# Patient Record
Sex: Female | Born: 2000 | Race: Black or African American | Hispanic: No | Marital: Single | Smoking: Never smoker
Health system: Southern US, Community
[De-identification: ages and names within clinical notes are randomized; demographics above are authoritative.]

---
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
## 2020-03-15 ENCOUNTER — Ambulatory Visit (LOCAL_COMMUNITY_HEALTH_CENTER): Payer: Medicaid Other | Admitting: Physician Assistant

## 2020-03-15 ENCOUNTER — Ambulatory Visit: Payer: Medicaid Other

## 2020-03-15 ENCOUNTER — Other Ambulatory Visit: Payer: Self-pay

## 2020-03-15 ENCOUNTER — Encounter: Payer: Self-pay | Admitting: Physician Assistant

## 2020-03-15 VITALS — BP 119/53 | Ht 65.0 in | Wt 137.0 lb

## 2020-03-15 DIAGNOSIS — Z3046 Encounter for surveillance of implantable subdermal contraceptive: Secondary | ICD-10-CM | POA: Diagnosis not present

## 2020-03-15 DIAGNOSIS — Z113 Encounter for screening for infections with a predominantly sexual mode of transmission: Secondary | ICD-10-CM

## 2020-03-15 DIAGNOSIS — B9689 Other specified bacterial agents as the cause of diseases classified elsewhere: Secondary | ICD-10-CM

## 2020-03-15 DIAGNOSIS — Z0000 Encounter for general adult medical examination without abnormal findings: Secondary | ICD-10-CM

## 2020-03-15 DIAGNOSIS — N760 Acute vaginitis: Secondary | ICD-10-CM

## 2020-03-15 DIAGNOSIS — Z3009 Encounter for other general counseling and advice on contraception: Secondary | ICD-10-CM

## 2020-03-15 LAB — WET PREP FOR TRICH, YEAST, CLUE
Trichomonas Exam: NEGATIVE
Yeast Exam: NEGATIVE

## 2020-03-15 MED ORDER — METRONIDAZOLE 500 MG PO TABS: 500.0000 mg | ORAL_TABLET | Freq: Two times a day (BID) | ORAL | 0 refills | Status: AC

## 2020-03-15 NOTE — Progress Notes (Signed)
Wet mount reviewed by provider; per verbal order by Sadie Haber, PA, pt treated for BV per standing order. Provider orders completed.

## 2020-03-15 NOTE — Progress Notes (Signed)
Pt is here for physical, to check Nexplanon and STD screening. Pt reports no issues with Nexplanon at this time. Pt feels like her PH balance has been off. Per [facility] records. Nexplanon was placed 06/2017.

## 2020-03-16 ENCOUNTER — Encounter: Payer: Self-pay | Admitting: Physician Assistant

## 2020-03-16 NOTE — Progress Notes (Signed)
Family Planning Visit- Repeat Yearly Visit  Subjective:  Cheyenne Wise is a 19 y.o. No obstetric history on file.  being seen today for an well woman visit and to discuss family planning options.    She is currently using Nexplanon for pregnancy prevention. Patient reports she does not  want a pregnancy in the next year. Patient  does not have a problem list on file.  Chief Complaint  Patient presents with  . Contraception    Physical and STD screening    Patient reports she is doing OK with the Nexplanon but is not sure yet whether she will get another one when it is time for removal or try something else.  Patient states that since she got the Nexplanon she has had weight fluctuations.  Reports that she has had a vaginal odor sometimes with irritation and thinks that her pH balance is off. Per chart review, CBE due 2022 and pap is due 2023.  Patient denies any other concerns today.    See flowsheet for other program required questions.   Body mass index is 22.8 kg/m. - Patient is eligible for diabetes screening based on BMI and age >61?  not applicable HA1C ordered? not applicable  Patient reports 2  partners in last year. Desires STI screening?  Yes   Has patient been screened once for HCV in the past?  No  No results found for: HCVAB  Does the patient have current of drug use, have a partner with drug use, and/or has been incarcerated since last result? No  If yes-- Screen for HCV through [facility]   Does the patient meet criteria for HBV testing? No  Criteria:  -Household, sexual or needle sharing contact with HBV -History of drug use -HIV positive -Those with known Hep C   Health Maintenance Due  Topic Date Due  . Hepatitis C Screening  Never done  . CHLAMYDIA SCREENING  Never done  . HIV Screening  Never done  . INFLUENZA VACCINE  01/21/2020    Review of Systems  All other systems reviewed and are negative.   The following portions of the patient's  history were reviewed and updated as appropriate: allergies, current medications, past family history, past medical history, past social history, past surgical history and problem list. Problem list updated.  Objective:   Vitals:   03/15/20 0845  BP: (!) 119/53  Weight: 137 lb (62.1 kg)  Height: 5\' 5"  (1.651 m)    Physical Exam Vitals and nursing note reviewed.  Constitutional:      General: She is not in acute distress.    Appearance: Normal appearance.  HENT:     Head: Normocephalic and atraumatic.  Eyes:     Conjunctiva/sclera: Conjunctivae normal.  Neck:     Thyroid: No thyroid mass, thyromegaly or thyroid tenderness.  Cardiovascular:     Rate and Rhythm: Normal rate and regular rhythm.  Pulmonary:     Effort: Pulmonary effort is normal.     Breath sounds: Normal breath sounds.  Abdominal:     Palpations: Abdomen is soft. There is no mass.     Tenderness: There is no abdominal tenderness. There is no guarding or rebound.  Musculoskeletal:     Cervical back: Neck supple. No tenderness.     Comments: Left upper arm with Nexplanon easily palpable.  Lymphadenopathy:     Cervical: No cervical adenopathy.  Skin:    General: Skin is warm and dry.  Neurological:  Mental Status: She is alert and oriented to person, place, and time.  Psychiatric:        Mood and Affect: Mood normal.        Behavior: Behavior normal.        Thought Content: Thought content normal.        Judgment: Judgment normal.       Assessment and Plan:  Cheyenne Wise is a 19 y.o. female No obstetric history on file. presenting to the [facility] Department for an yearly well woman exam/family planning visit  Contraception counseling: Reviewed all forms of birth control options in the tiered based approach. available including abstinence; over the counter/barrier methods; hormonal contraceptive medication including pill, patch, ring, injection,contraceptive implant, ECP; hormonal and  nonhormonal IUDs; permanent sterilization options including vasectomy and the various tubal sterilization modalities. Risks, benefits, and typical effectiveness rates were reviewed.  Questions were answered.  Written information was also given to the patient to review.  Patient desires to continue with Nexplanon until removal is due, this was prescribed for patient. She will follow up in  4 months  for surveillance.  She was told to call with any further questions, or with any concerns about this method of contraception.  Emphasized use of condoms 100% of the time for STI prevention.  Patient was not a candidate for ECP today.    1. Encounter for counseling regarding contraception Reviewed with patient BCMs and SE so that patient can think about options and decide by the time Nexplanon removal is due. Rec condoms with all sex.  2. Screening for STD (sexually transmitted disease) Await test results.  Counseled that RN will call if she needs to RTC for treatment once results are back.  - WET PREP FOR TRICH, YEAST, CLUE - Chlamydia/Gonorrhea  Lab  3. Encounter for surveillance of implantable subdermal contraceptive Nexplanon palpable in upper left arm.  4. Well woman exam (no gynecological exam) Reviewed healthy habits with patient for general health. Enc MVI 1 po daily. Enc to establish with/follow up with PCP for primary care concerns and illness.  5. BV (bacterial vaginosis) Treat for BV with Metronidazole 500 mg #14 1 po BID for 7 days with food, no EtOH for 24 hr before and until 72 hr after completing medicine. No sex for 7 days. Enc to use OTC antifungal cream if has itching during or just after antibiotic use. Reviewed with patient steps to prevent BV and yeast: Wear all-cotton underwear Sleep without underwear Take showers instead of baths Wear loose fitting clothing, especially during warm/hot weather Use a hair dryer on low after bathing to dry the area Avoid scented  soaps and body washes Do not douche May try over the counter probiotics or boric acid gel or suppositories Stop smoking - metroNIDAZOLE (FLAGYL) 500 MG tablet; Take 1 tablet (500 mg total) by mouth 2 (two) times daily for 7 days.  Dispense: 14 tablet; Refill: 0     Return in about 4 months (around 07/15/2020) for Nexplanon removal , 1 yr RP and prn.  No future appointments.  Matt Holmes, PA

## 2020-04-16 ENCOUNTER — Ambulatory Visit: Payer: Medicaid Other | Admitting: Physician Assistant

## 2020-04-16 ENCOUNTER — Other Ambulatory Visit: Payer: Self-pay

## 2020-04-16 DIAGNOSIS — Z113 Encounter for screening for infections with a predominantly sexual mode of transmission: Secondary | ICD-10-CM | POA: Diagnosis not present

## 2020-04-16 DIAGNOSIS — Z299 Encounter for prophylactic measures, unspecified: Secondary | ICD-10-CM

## 2020-04-16 DIAGNOSIS — Z7189 Other specified counseling: Secondary | ICD-10-CM

## 2020-04-16 LAB — WET PREP FOR TRICH, YEAST, CLUE
Trichomonas Exam: NEGATIVE
Yeast Exam: NEGATIVE

## 2020-04-16 MED ORDER — METRONIDAZOLE 500 MG PO TABS: 2000.0000 mg | ORAL_TABLET | Freq: Once | ORAL | 0 refills | Status: AC

## 2020-04-16 NOTE — Progress Notes (Signed)
Wet mount reviewed by provider; pt treated with 2 grams Metronidazole per provider order. Pt accepted LCSW business card. Provider orders completed.

## 2020-04-17 ENCOUNTER — Encounter: Payer: Self-pay | Admitting: Physician Assistant

## 2020-04-17 NOTE — Progress Notes (Signed)
  [facility] Department STI clinic/screening visit  Subjective:  Cheyenne Wise is a 19 y.o. female being seen today for an STI screening visit. The patient reports they do have symptoms.  Patient reports that they do not desire a pregnancy in the next year.   They reported they are not interested in discussing contraception today.  Patient's last menstrual period was 03/27/2020.   Patient has the following medical conditions:  There are no problems to display for this patient.   Chief Complaint  Patient presents with  . SEXUALLY TRANSMITTED DISEASE    screening    HPI  Patient reports that she has had vaginal irritation and odor for 1 week.  Denied other symptoms, chronic conditions, surgeries and regular medicines.  States that she has not had a HIV test and pap is not due until she is 19 years old.   See flowsheet for further details and programmatic requirements.    The following portions of the patient's history were reviewed and updated as appropriate: allergies, current medications, past medical history, past social history, past surgical history and problem list.  Objective:  There were no vitals filed for this visit.  Physical Exam Constitutional:      General: She is not in acute distress.    Appearance: Normal appearance.  HENT:     Head: Normocephalic and atraumatic.     Comments: No nits,lice, or hair loss. No cervical, supraclavicular or axillary adenopathy. Eyes:     Conjunctiva/sclera: Conjunctivae normal.  Pulmonary:     Effort: Pulmonary effort is normal.  Skin:    General: Skin is warm and dry.     Findings: No bruising, erythema, lesion or rash.  Neurological:     Mental Status: She is alert and oriented to person, place, and time.  Psychiatric:        Mood and Affect: Mood normal.        Behavior: Behavior normal.        Thought Content: Thought content normal.        Judgment: Judgment normal.      Assessment and Plan:   Cheyenne Wise is a 19 y.o. female presenting to the [facility] Department for STI screening  1. Screening for STD (sexually transmitted disease) Patient into clinic with symptoms. Patient declines blood work today and opts to self collect vaginal samples for testing today.  Counseled patient how to collect samples for accurate results.   Rec condoms with all sex. Await test results.  Counseled that RN will call if needs to RTC for treatment once results are back. - WET PREP FOR TRICH, YEAST, CLUE - Chlamydia/Gonorrhea [place] [facility]  2. Prophylactic measure Will treat with Metronidazole 2 g po with food, no EtOH for 24 hr before and until 72 hr after taking medicine to treat symptoms. - metroNIDAZOLE (FLAGYL) 500 MG tablet; Take 4 tablets (2,000 mg total) by mouth once for 1 dose.  Dispense: 4 tablet; Refill: 0  3. Other specified counseling Steps to prevent BV and yeast: Wear all-cotton underwear Sleep without underwear Take showers instead of baths Wear loose fitting clothing, especially during warm/hot weather Use a hair dryer on low after bathing to dry the area Avoid scented soaps and body washes Do not douche May try over the counter probiotics or boric acid gel or suppositories Stop smoking     No follow-ups on file.  No future appointments.  Matt Holmes, PA

## 2020-06-26 ENCOUNTER — Other Ambulatory Visit: Payer: Self-pay

## 2020-06-26 ENCOUNTER — Emergency Department
Admission: EM | Admit: 2020-06-26 | Discharge: 2020-06-26 | Disposition: A | Discharge status: Home / Self Care | Payer: Medicaid Other | Attending: Emergency Medicine | Admitting: Emergency Medicine

## 2020-06-26 DIAGNOSIS — N3000 Acute cystitis without hematuria: Secondary | ICD-10-CM | POA: Insufficient documentation

## 2020-06-26 DIAGNOSIS — R112 Nausea with vomiting, unspecified: Secondary | ICD-10-CM

## 2020-06-26 DIAGNOSIS — U071 COVID-19: Secondary | ICD-10-CM | POA: Diagnosis not present

## 2020-06-26 LAB — CBC
HCT: 43.4 % (ref 36.0–46.0)
Hemoglobin: 13.8 g/dL (ref 12.0–15.0)
MCH: 29.4 pg (ref 26.0–34.0)
MCHC: 31.8 g/dL (ref 30.0–36.0)
MCV: 92.3 fL (ref 80.0–100.0)
Platelets: 155 10*3/uL (ref 150–400)
RBC: 4.7 MIL/uL (ref 3.87–5.11)
RDW: 11.9 % (ref 11.5–15.5)
WBC: 4.5 10*3/uL (ref 4.0–10.5)
nRBC: 0 % (ref 0.0–0.2)

## 2020-06-26 LAB — URINALYSIS, COMPLETE (UACMP) WITH MICROSCOPIC
Bilirubin Urine: NEGATIVE
Glucose, UA: NEGATIVE mg/dL
Hgb urine dipstick: NEGATIVE
Ketones, ur: 20 mg/dL — AB
Nitrite: NEGATIVE
Protein, ur: 30 mg/dL — AB
Specific Gravity, Urine: 1.028 (ref 1.005–1.030)
pH: 5 (ref 5.0–8.0)

## 2020-06-26 LAB — COMPREHENSIVE METABOLIC PANEL
ALT: 47 U/L — ABNORMAL HIGH (ref 0–44)
AST: 39 U/L (ref 15–41)
Albumin: 4.7 g/dL (ref 3.5–5.0)
Alkaline Phosphatase: 57 U/L (ref 38–126)
Anion gap: 10 (ref 5–15)
BUN: 10 mg/dL (ref 6–20)
CO2: 26 mmol/L (ref 22–32)
Calcium: 9.5 mg/dL (ref 8.9–10.3)
Chloride: 103 mmol/L (ref 98–111)
Creatinine, Ser: 0.78 mg/dL (ref 0.44–1.00)
GFR, Estimated: >60 mL/min (ref >60)
Glucose, Bld: 99 mg/dL (ref 70–99)
Potassium: 3.9 mmol/L (ref 3.5–5.1)
Sodium: 139 mmol/L (ref 135–145)
Total Bilirubin: 0.6 mg/dL (ref 0.3–1.2)
Total Protein: 8 g/dL (ref 6.5–8.1)

## 2020-06-26 LAB — POC URINE PREG, ED: Preg Test, Ur: NEGATIVE

## 2020-06-26 LAB — LIPASE, BLOOD: Lipase: 23 U/L (ref 11–51)

## 2020-06-26 LAB — POC SARS CORONAVIRUS 2 AG -  ED: SARS Coronavirus 2 Ag: POSITIVE — AB

## 2020-06-26 MED ORDER — SULFAMETHOXAZOLE-TRIMETHOPRIM 800-160 MG PO TABS
1.0000 | ORAL_TABLET | Freq: Once | ORAL | Status: AC
  Administered 2020-06-26: 1 via ORAL
  Filled 2020-06-26: qty 1

## 2020-06-26 MED ORDER — ONDANSETRON 4 MG PO TBDP
4.0000 mg | ORAL_TABLET | Freq: Three times a day (TID) | ORAL | 0 refills | Status: DC | PRN
Start: 2020-06-26 — End: 2021-03-27

## 2020-06-26 MED ORDER — SULFAMETHOXAZOLE-TRIMETHOPRIM 800-160 MG PO TABS
1.0000 | ORAL_TABLET | Freq: Two times a day (BID) | ORAL | 0 refills | Status: AC
Start: 2020-06-26 — End: 2020-07-03

## 2020-06-26 MED ORDER — ONDANSETRON 4 MG PO TBDP
4.0000 mg | ORAL_TABLET | Freq: Once | ORAL | Status: AC
  Administered 2020-06-26: 4 mg via ORAL
  Filled 2020-06-26: qty 1

## 2020-06-26 NOTE — ED Provider Notes (Signed)
[facility] Emergency Department Provider Note  ____________________________________________   Event Date/Time   First MD Initiated Contact with Patient 06/26/20 1725     (approximate)  I have reviewed the triage vital signs and the nursing notes.   HISTORY  Chief Complaint Emesis  HPI Cheyenne Wise is a 20 y.o. female who reports to the emergency department for evaluation of emesis over the last 4 days.  She states that she has been unable to keep anything down since that time.  She denies any associated abdominal pain or diarrhea.  She denies any dysuria.  She did have a exposure to her aunt who is Covid positive and she is not vaccinated against COVID-19.  She denies any cough, fever, chest pain, shortness of breath.         History reviewed. No pertinent past medical history.  There are no problems to display for this patient.   History reviewed. No pertinent surgical history.  Prior to Admission medications   Medication Sig Start Date End Date Taking? Authorizing Provider  ondansetron (ZOFRAN ODT) 4 MG disintegrating tablet Take 1 tablet (4 mg total) by mouth every 8 (eight) hours as needed for nausea or vomiting. 06/26/20  Yes Lucy Chris, PA  sulfamethoxazole-trimethoprim (BACTRIM DS) 800-160 MG tablet Take 1 tablet by mouth 2 (two) times daily for 7 days. 06/26/20 07/03/20 Yes Ziyonna Christner, Ruben Gottron, PA  amoxicillin (AMOXIL) 875 MG tablet Take 1 tablet (875 mg total) by mouth 2 (two) times daily. Patient not taking: Reported on 03/15/2020 08/01/15   Hagler, Jami L, PA-C  etonogestrel (NEXPLANON) 68 MG IMPL implant 1 each by Subdermal route once. 06/30/17   Larene Pickett, FNP  ibuprofen (ADVIL,MOTRIN) 600 MG tablet Take 1 tablet (600 mg total) by mouth every 8 (eight) hours as needed. Patient not taking: Reported on 03/15/2020 01/10/16   Emily Filbert, MD  lidocaine (XYLOCAINE) 2 % solution Use as directed 10 mLs in the mouth or throat as  needed for mouth pain. Patient not taking: Reported on 03/15/2020 02/21/18   Enid Derry, PA-C  magic mouthwash w/lidocaine SOLN Take 5 mLs by mouth 4 (four) times daily as needed for mouth pain. Patient not taking: Reported on 03/15/2020 08/01/15   Hagler, Jami L, PA-C  promethazine (PHENERGAN) 12.5 MG tablet Take 1 tablet (12.5 mg total) by mouth every 6 (six) hours as needed for nausea or vomiting. Patient not taking: Reported on 03/15/2020 09/24/17   Minna Antis, MD    Allergies Patient has no known allergies.  Family History  Problem Relation Age of Onset  . Hypertension Paternal Grandmother     Social History Social History   Tobacco Use  . Smoking status: Never Smoker  . Smokeless tobacco: Never Used  Substance Use Topics  . Alcohol use: No  . Drug use: Never    Review of Systems Constitutional: No fever/chills Eyes: No visual changes. ENT: No sore throat. Cardiovascular: Denies chest pain. Respiratory: Denies shortness of breath. Gastrointestinal: No abdominal pain.  + nausea, + vomiting.  No diarrhea.  No constipation. Genitourinary: Negative for dysuria. Musculoskeletal: Negative for back pain. Skin: Negative for rash. Neurological: Negative for headaches, focal weakness or numbness.  ____________________________________________   PHYSICAL EXAM:  VITAL SIGNS: ED Triage Vitals  Enc Vitals Group     BP 06/26/20 1426 130/72     Pulse Rate 06/26/20 1426 71     Resp 06/26/20 1426 18     Temp 06/26/20 1426 98.6 F (  37 C)     Temp Source 06/26/20 1426 Oral     SpO2 06/26/20 1426 100 %     Weight 06/26/20 1428 130 lb (59 kg)     Height 06/26/20 1428 5\' 5"  (1.651 m)     Head Circumference --      Peak Flow --      Pain Score 06/26/20 1427 0     Pain Loc --      Pain Edu? --      Excl. in GC? --    Constitutional: Alert and oriented. Well appearing and in no acute distress. Eyes: Conjunctivae are normal. PERRL. EOMI. Head: Atraumatic. Nose: No  congestion/rhinnorhea. Mouth/Throat: Mucous membranes are moist.  Oropharynx erythematous without any tonsillar enlargement or exudate. Neck: No stridor.   Cardiovascular: Normal rate, regular rhythm. Grossly normal heart sounds.  Good peripheral circulation. Respiratory: Normal respiratory effort.  No retractions. Lungs CTAB. Gastrointestinal: Soft and nontender. No distention. No abdominal bruits. No CVA tenderness. Musculoskeletal: No lower extremity tenderness nor edema.  No joint effusions. Neurologic:  Normal speech and language. No gross focal neurologic deficits are appreciated. No gait instability. Skin:  Skin is warm, dry and intact. No rash noted. Psychiatric: Mood and affect are normal. Speech and behavior are normal.  ____________________________________________   LABS (all labs ordered are listed, but only abnormal results are displayed)  Labs Reviewed  COMPREHENSIVE METABOLIC PANEL - Abnormal; Notable for the following components:      Result Value   ALT 47 (*)    All other components within normal limits  URINALYSIS, COMPLETE (UACMP) WITH MICROSCOPIC - Abnormal; Notable for the following components:   Color, Urine AMBER (*)    APPearance CLOUDY (*)    Ketones, ur 20 (*)    Protein, ur 30 (*)    Leukocytes,Ua TRACE (*)    Bacteria, UA RARE (*)    Non Squamous Epithelial PRESENT (*)    All other components within normal limits  POC SARS CORONAVIRUS 2 AG -  ED - Abnormal; Notable for the following components:   SARS Coronavirus 2 Ag POSITIVE (*)    All other components within normal limits  URINE CULTURE  LIPASE, BLOOD  CBC  POC URINE PREG, ED    ____________________________________________   INITIAL IMPRESSION / ASSESSMENT AND PLAN / ED COURSE  As part of my medical decision making, I reviewed the following data within the electronic MEDICAL RECORD NUMBER Nursing notes reviewed and incorporated and Labs reviewed         Patient is a 20 year old female who  reports to the emergency department for evaluation of vomiting over the last 4 days.  She had a known Covid exposure.  See HPI for further details.  On physical exam, the patient appears overall well.  Her vitals in triage are normal.  She does not have any abdominal pain, CVA tenderness or other acute finding.  CBC and CMP are grossly unremarkable.  Lipase is normal.  Urine pregnancy negative, urinalysis demonstrates some mild ketonuria, proteinuria and bacteria with trace leukocytes.  It is possible this was a contaminated sample given the number of squamous epithelials present, however the patient is vomiting.  The patient also tested positive for COVID-19.  We will treat her vomiting symptomatically with Zofran.  We will also initiate therapy for UTI and will send urine for culture.  Patient is amenable with this plan and she is stable at this time for outpatient therapy.  She was instructed to return  to the emergency department with any acute worsening.      ____________________________________________   FINAL CLINICAL IMPRESSION(S) / ED DIAGNOSES  Final diagnoses:  COVID  Non-intractable vomiting with nausea, unspecified vomiting type  Acute cystitis without hematuria     ED Discharge Orders         Ordered    ondansetron (ZOFRAN ODT) 4 MG disintegrating tablet  Every 8 hours PRN        06/26/20 1804    sulfamethoxazole-trimethoprim (BACTRIM DS) 800-160 MG tablet  2 times daily        06/26/20 1843          *Please note:  Cheyenne Wise was evaluated in Emergency Department on 06/26/2020 for the symptoms described in the history of present illness. She was evaluated in the context of the global COVID-19 pandemic, which necessitated consideration that the patient might be at risk for infection with the SARS-CoV-2 virus that causes COVID-19. Institutional protocols and algorithms that pertain to the evaluation of patients at risk for COVID-19 are in a state of rapid change based on  information released by regulatory bodies including the CDC and federal and state organizations. These policies and algorithms were followed during the patient's care in the ED.  Some ED evaluations and interventions may be delayed as a result of limited staffing during and the pandemic.*   Note:  This document was prepared using Dragon voice recognition software and may include unintentional dictation errors.    Marlana Salvage, PA 06/26/20 2315    Nena Polio, MD 06/27/20 [phone]

## 2020-06-26 NOTE — ED Triage Notes (Signed)
Pt comes pov with emesis, runny nose, congestion for about 4 days.

## 2020-06-29 LAB — URINE CULTURE: Culture: >=100000 — AB

## 2020-07-29 ENCOUNTER — Ambulatory Visit: Payer: Medicaid Other

## 2021-03-27 ENCOUNTER — Emergency Department
Admission: EM | Admit: 2021-03-27 | Discharge: 2021-03-27 | Disposition: A | Discharge status: Home / Self Care | Payer: Medicaid Other | Attending: Emergency Medicine | Admitting: Emergency Medicine

## 2021-03-27 ENCOUNTER — Other Ambulatory Visit: Payer: Self-pay

## 2021-03-27 DIAGNOSIS — R112 Nausea with vomiting, unspecified: Secondary | ICD-10-CM | POA: Diagnosis not present

## 2021-03-27 LAB — URINALYSIS, COMPLETE (UACMP) WITH MICROSCOPIC
Bacteria, UA: NONE SEEN
Bilirubin Urine: NEGATIVE
Glucose, UA: NEGATIVE mg/dL
Hgb urine dipstick: NEGATIVE
Ketones, ur: 20 mg/dL — AB
Leukocytes,Ua: NEGATIVE
Nitrite: NEGATIVE
Protein, ur: 30 mg/dL — AB
Specific Gravity, Urine: 1.027 (ref 1.005–1.030)
pH: 5 (ref 5.0–8.0)

## 2021-03-27 LAB — CBC
HCT: 42.4 % (ref 36.0–46.0)
Hemoglobin: 13.6 g/dL (ref 12.0–15.0)
MCH: 29.7 pg (ref 26.0–34.0)
MCHC: 32.1 g/dL (ref 30.0–36.0)
MCV: 92.6 fL (ref 80.0–100.0)
Platelets: 236 10*3/uL (ref 150–400)
RBC: 4.58 MIL/uL (ref 3.87–5.11)
RDW: 12.1 % (ref 11.5–15.5)
WBC: 10.1 10*3/uL (ref 4.0–10.5)
nRBC: 0 % (ref 0.0–0.2)

## 2021-03-27 LAB — COMPREHENSIVE METABOLIC PANEL
ALT: 10 U/L (ref 0–44)
AST: 13 U/L — ABNORMAL LOW (ref 15–41)
Albumin: 4.3 g/dL (ref 3.5–5.0)
Alkaline Phosphatase: 57 U/L (ref 38–126)
Anion gap: 8 (ref 5–15)
BUN: 8 mg/dL (ref 6–20)
CO2: 24 mmol/L (ref 22–32)
Calcium: 9.6 mg/dL (ref 8.9–10.3)
Chloride: 106 mmol/L (ref 98–111)
Creatinine, Ser: 0.83 mg/dL (ref 0.44–1.00)
GFR, Estimated: >60 mL/min (ref >60)
Glucose, Bld: 99 mg/dL (ref 70–99)
Potassium: 3.8 mmol/L (ref 3.5–5.1)
Sodium: 138 mmol/L (ref 135–145)
Total Bilirubin: 0.6 mg/dL (ref 0.3–1.2)
Total Protein: 7.1 g/dL (ref 6.5–8.1)

## 2021-03-27 LAB — LIPASE, BLOOD: Lipase: 25 U/L (ref 11–51)

## 2021-03-27 LAB — POC URINE PREG, ED: Preg Test, Ur: NEGATIVE

## 2021-03-27 MED ORDER — ONDANSETRON 8 MG PO TBDP
8.0000 mg | ORAL_TABLET | Freq: Once | ORAL | Status: AC
  Administered 2021-03-27: 8 mg via ORAL
  Filled 2021-03-27: qty 1

## 2021-03-27 MED ORDER — ONDANSETRON 4 MG PO TBDP: 4.0000 mg | ORAL_TABLET | Freq: Three times a day (TID) | ORAL | 0 refills | Status: DC | PRN

## 2021-03-27 NOTE — ED Provider Notes (Signed)
[facility] Emergency Department Provider Note  ____________________________________________  Time seen: Approximately 8:50 PM  I have reviewed the triage vital signs and the nursing notes.   HISTORY  Chief Complaint Emesis    HPI Cheyenne Wise is a 20 y.o. female who presents the emergency department for nausea, vomiting.  Symptoms started after eating at a cookout and patient believes that she has food poisoning.  No fevers, chills, URI symptoms.  Patient has had no diarrhea or constipation.  No other complaints at this time.  Patient denies any abdominal pain, blood in her emesis.       History reviewed. No pertinent past medical history.  There are no problems to display for this patient.   History reviewed. No pertinent surgical history.  Prior to Admission medications   Medication Sig Start Date End Date Taking? Authorizing Provider  ondansetron (ZOFRAN-ODT) 4 MG disintegrating tablet Take 1 tablet (4 mg total) by mouth every 8 (eight) hours as needed for nausea or vomiting. 03/27/21  Yes Brody Kump, Delorise Royals, PA-C  amoxicillin (AMOXIL) 875 MG tablet Take 1 tablet (875 mg total) by mouth 2 (two) times daily. Patient not taking: Reported on 03/15/2020 08/01/15   Hagler, Jami L, PA-C  etonogestrel (NEXPLANON) 68 MG IMPL implant 1 each by Subdermal route once. 06/30/17   Larene Pickett, FNP  ibuprofen (ADVIL,MOTRIN) 600 MG tablet Take 1 tablet (600 mg total) by mouth every 8 (eight) hours as needed. Patient not taking: Reported on 03/15/2020 01/10/16   Emily Filbert, MD  lidocaine (XYLOCAINE) 2 % solution Use as directed 10 mLs in the mouth or throat as needed for mouth pain. Patient not taking: Reported on 03/15/2020 02/21/18   Enid Derry, PA-C  magic mouthwash w/lidocaine SOLN Take 5 mLs by mouth 4 (four) times daily as needed for mouth pain. Patient not taking: Reported on 03/15/2020 08/01/15   Hagler, Jami L, PA-C  promethazine (PHENERGAN) 12.5  MG tablet Take 1 tablet (12.5 mg total) by mouth every 6 (six) hours as needed for nausea or vomiting. Patient not taking: Reported on 03/15/2020 09/24/17   Minna Antis, MD    Allergies Patient has no known allergies.  Family History  Problem Relation Age of Onset   Hypertension Paternal Grandmother     Social History Social History   Tobacco Use   Smoking status: Never   Smokeless tobacco: Never  Substance Use Topics   Alcohol use: No   Drug use: Never     Review of Systems  Constitutional: No fever/chills Eyes: No visual changes. No discharge ENT: No upper respiratory complaints. Cardiovascular: no chest pain. Respiratory: no cough. No SOB. Gastrointestinal: No abdominal pain.  Positive for nausea and vomiting.  No diarrhea.  No constipation. Musculoskeletal: Negative for musculoskeletal pain. Skin: Negative for rash, abrasions, lacerations, ecchymosis. Neurological: Negative for headaches, focal weakness or numbness.  10 System ROS otherwise negative.  ____________________________________________   PHYSICAL EXAM:  VITAL SIGNS: ED Triage Vitals  Enc Vitals Group     BP 03/27/21 1640 132/64     Pulse Rate 03/27/21 1640 65     Resp 03/27/21 1640 16     Temp 03/27/21 1640 98.7 F (37.1 C)     Temp Source 03/27/21 1640 Oral     SpO2 03/27/21 1640 99 %     Weight 03/27/21 1641 150 lb (68 kg)     Height 03/27/21 1641 5\' 5"  (1.651 m)     Head Circumference --  Peak Flow --      Pain Score 03/27/21 1641 5     Pain Loc --      Pain Edu? --      Excl. in GC? --      Constitutional: Alert and oriented. Well appearing and in no acute distress. Eyes: Conjunctivae are normal. PERRL. EOMI. Head: Atraumatic. ENT:      Ears:       Nose: No congestion/rhinnorhea.      Mouth/Throat: Mucous membranes are moist.  Neck: No stridor.    Cardiovascular: Normal rate, regular rhythm. Normal S1 and S2.  Good peripheral circulation. Respiratory: Normal respiratory  effort without tachypnea or retractions. Lungs CTAB. Good air entry to the bases with no decreased or absent breath sounds. Gastrointestinal: Bowel sounds 4 quadrants. Soft and nontender to palpation. No guarding or rigidity. No palpable masses. No distention. No CVA tenderness. Musculoskeletal: Full range of motion to all extremities. No gross deformities appreciated. Neurologic:  Normal speech and language. No gross focal neurologic deficits are appreciated.  Skin:  Skin is warm, dry and intact. No rash noted. Psychiatric: Mood and affect are normal. Speech and behavior are normal. Patient exhibits appropriate insight and judgement.   ____________________________________________   LABS (all labs ordered are listed, but only abnormal results are displayed)  Labs Reviewed  COMPREHENSIVE METABOLIC PANEL - Abnormal; Notable for the following components:      Result Value   AST 13 (*)    All other components within normal limits  URINALYSIS, COMPLETE (UACMP) WITH MICROSCOPIC - Abnormal; Notable for the following components:   Color, Urine YELLOW (*)    APPearance HAZY (*)    Ketones, ur 20 (*)    Protein, ur 30 (*)    All other components within normal limits  LIPASE, BLOOD  CBC  POC URINE PREG, ED   ____________________________________________  EKG   ____________________________________________  RADIOLOGY   No results found.  ____________________________________________    PROCEDURES  Procedure(s) performed:    Procedures    Medications  ondansetron (ZOFRAN-ODT) disintegrating tablet 8 mg (has no administration in time range)     ____________________________________________   INITIAL IMPRESSION / ASSESSMENT AND PLAN / ED COURSE  Pertinent labs & imaging results that were available during my care of the patient were reviewed by me and considered in my medical decision making (see chart for details).  Review of the [place] CSRS was performed in accordance of  the [facility] prior to dispensing any controlled drugs.           Patient's diagnosis is consistent with nausea and vomiting, likely food poisoning.  Patient presents emergency department with nausea and vomiting after eating out at a restaurant.  Patient states that symptoms began almost immediately.  No abdominal pain.  No diarrhea.  No other complaints.  Patient will have Zofran here.  Labs are reassuring.  At this time patient will be discharged with Zofran.  Follow-up instructions discussed with the patient.  Follow-up primary care as needed..  Patient is given ED precautions to return to the ED for any worsening or new symptoms.     ____________________________________________  FINAL CLINICAL IMPRESSION(S) / ED DIAGNOSES  Final diagnoses:  Nausea and vomiting, unspecified vomiting type      NEW MEDICATIONS STARTED DURING THIS VISIT:  ED Discharge Orders          Ordered    ondansetron (ZOFRAN-ODT) 4 MG disintegrating tablet  Every 8 hours PRN  03/27/21 2053                This chart was dictated using voice recognition software/Dragon. Despite best efforts to proofread, errors can occur which can change the meaning. Any change was purely unintentional.    Racheal Patches, PA-C 03/27/21 2055    Chesley Noon, MD 04/03/21 [phone]

## 2021-03-27 NOTE — ED Triage Notes (Signed)
Pt to ER via POV with complaints of nausea and emesis x3 days. Reports being unable to keep down food or fluids. Reports some abdominal discomfort associated with vomiting.   Denies urinary symptoms.

## 2021-03-27 NOTE — Discharge Instructions (Addendum)
If you develop diarrhea, please pick up loperamide/Imodium from any pharmacy.

## 2021-04-30 ENCOUNTER — Ambulatory Visit: Payer: Medicaid Other

## 2021-09-29 ENCOUNTER — Ambulatory Visit (LOCAL_COMMUNITY_HEALTH_CENTER): Payer: Medicaid Other | Admitting: Family Medicine

## 2021-09-29 ENCOUNTER — Encounter: Payer: Self-pay | Admitting: Family Medicine

## 2021-09-29 VITALS — BP 117/58 | Ht 65.0 in | Wt 140.0 lb

## 2021-09-29 DIAGNOSIS — Z3046 Encounter for surveillance of implantable subdermal contraceptive: Secondary | ICD-10-CM | POA: Diagnosis not present

## 2021-09-29 DIAGNOSIS — Z0000 Encounter for general adult medical examination without abnormal findings: Secondary | ICD-10-CM | POA: Diagnosis not present

## 2021-09-29 DIAGNOSIS — Z113 Encounter for screening for infections with a predominantly sexual mode of transmission: Secondary | ICD-10-CM

## 2021-09-29 DIAGNOSIS — Z309 Encounter for contraceptive management, unspecified: Secondary | ICD-10-CM

## 2021-09-29 LAB — WET PREP FOR TRICH, YEAST, CLUE
Trichomonas Exam: NEGATIVE
Yeast Exam: NEGATIVE

## 2021-09-29 LAB — HM HIV SCREENING LAB: HM HIV Screening: NEGATIVE

## 2021-09-29 NOTE — Progress Notes (Signed)
[facility] DEPARTMENT ?[facility] ?[address] [employer] ?Main Number: [phone] ? ? ? ?Family Planning Visit- Initial Visit ? ?Subjective:  ?Cheyenne Wise is a 21 y.o.  [id number]   being seen today for an initial annual visit and to discuss reproductive life planning.  The patient is currently using Hormonal Implant for pregnancy prevention. Patient reports   does not want a pregnancy in the next year.   ? ?Report they are looking for a method that provides Other occasional use ? ?Patient has the following medical conditions does not have a problem list on file. ? ?Chief Complaint  ?Patient presents with  ? Annual Exam  ?  PE, STD check and Nexplanon removal  ? ? ?Patient reports she just wants a check and wants full STI screen.  She has one female partner currently. Nexplanon was placed in 2019.  ? ?Patient denies concerns  ? ?Body mass index is 23.3 kg/m?. - Patient is eligible for diabetes screening based on BMI and age >60?  not applicable ?HA1C ordered? not applicable ? ?Patient reports 2  partner/s in last year. Desires STI screening?  Yes ? ?Has patient been screened once for HCV in the past?  No ? No results found for: HCVAB ? ?Does the patient have current drug use (including MJ), have a partner with drug use, and/or has been incarcerated since last result? No  ?If yes-- Screen for HCV through [facility] ?  ?Does the patient meet criteria for HBV testing? No ? ?Criteria:  ?-Household, sexual or needle sharing contact with HBV ?-History of drug use ?-HIV positive ?-Those with known Hep C ? ? ?Health Maintenance Due  ?Topic Date Due  ? CHLAMYDIA SCREENING  Never done  ? HIV Screening  Never done  ? Hepatitis C Screening  Never done  ? ? ?Review of Systems  ?Constitutional:  Negative for chills and fever.  ?Eyes:  Negative for blurred vision and double vision.  ?Respiratory:  Negative for cough and shortness of breath.   ?Cardiovascular:  Negative for chest pain and  orthopnea.  ?Gastrointestinal:  Negative for nausea and vomiting.  ?Genitourinary:  Negative for dysuria, flank pain and frequency.  ?Musculoskeletal:  Negative for myalgias.  ?Skin:  Negative for rash.  ?Neurological:  Negative for dizziness, tingling, weakness and headaches.  ?Endo/Heme/Allergies:  Does not bruise/bleed easily.  ?Psychiatric/Behavioral:  Negative for depression and suicidal ideas. The patient is not nervous/anxious.   ? ?The following portions of the patient's history were reviewed and updated as appropriate: allergies, current medications, past family history, past medical history, past social history, past surgical history and problem list. Problem list updated. ? ? ?See flowsheet for other program required questions. ? ?Objective:  ? ?Vitals:  ? 09/29/21 1610  ?BP: (!) 117/58  ?Weight: 140 lb (63.5 kg)  ?Height: 5\' 5"  (1.651 m)  ? ? ?Physical Exam ?Constitutional:   ?   Appearance: Normal appearance.  ?HENT:  ?   Head: Normocephalic and atraumatic.  ?Pulmonary:  ?   Effort: Pulmonary effort is normal.  ?Abdominal:  ?   Palpations: Abdomen is soft.  ?Musculoskeletal:     ?   General: Normal range of motion.  ?Skin: ?   General: Skin is warm and dry.  ?Neurological:  ?   General: No focal deficit present.  ?   Mental Status: She is alert.  ?Psychiatric:     ?   Mood and Affect: Mood normal.     ?  Behavior: Behavior normal.  ? ? ?Nexplanon Removal ?Patient identified, informed consent performed, consent signed.   Appropriate time out taken. Nexplanon site identified.  Area prepped in usual sterile fashon. 3 ml of 1% lidocaine with Epinephrine was used to anesthetize the area at the distal end of the implant and along implant site. A small stab incision was made right beside the implant on the distal portion.  The Nexplanon rod was grasped using hemostat and removed without difficulty.  There was minimal blood loss. There were no complications.  Steri-strips were applied over the small incision.   A pressure bandage was applied to reduce any bruising.  The patient tolerated the procedure well and was given post procedure instructions.   ? ? ?Assessment and Plan:  ?Cheyenne Wise is a 21 y.o. female presenting to the [facility] Department for an initial annual wellness/contraceptive visit ? ?Contraception counseling: Reviewed options based on patient desire and reproductive life plan. Patient is interested in Female Condom. This was provided to the patient today.  ? ?Risks, benefits, and typical effectiveness rates were reviewed.  Questions were answered.  Written information was also given to the patient to review.   ? ?The patient will follow up in  1 years for surveillance.  The patient was told to call with any further questions, or with any concerns about this method of contraception.  Emphasized use of condoms 100% of the time for STI prevention. ? ?Need for ECP was assessed. Patient does not need ECP  ? ?1. Routine general medical examination at a health care facility ?CBE not indicated ?PAP @21  ?STI screening per below ?Does not desire hormonal control method.  ?Current partner is female ? ? ?2. Screening examination for venereal disease ?- Syphilis Serology, [place] [facility] ?- HIV [place] [facility] ?- Chlamydia/Gonorrhea [place] [facility] ?- WET PREP FOR TRICH, YEAST, CLUE ? ? ?Return in about 1 year (around 09/30/2022) for Yearly wellness exam. ? ?No future appointments. ? ?11/30/2022, MD ? ?

## 2021-09-29 NOTE — Progress Notes (Signed)
Pt here for PE, STD check and Nexplanon removal.  Wet mount results reviewed, no treatment required.  Provider removed the Nexplanon without any complications.  Berdie Ogren, RN ? ?

## 2021-10-05 ENCOUNTER — Encounter: Payer: Self-pay | Admitting: Family Medicine

## 2021-10-22 ENCOUNTER — Emergency Department
Admission: EM | Admit: 2021-10-22 | Discharge: 2021-10-22 | Disposition: A | Discharge status: Home / Self Care | Payer: Medicaid Other | Attending: Emergency Medicine | Admitting: Emergency Medicine

## 2021-10-22 ENCOUNTER — Encounter: Payer: Self-pay | Admitting: Emergency Medicine

## 2021-10-22 ENCOUNTER — Other Ambulatory Visit: Payer: Self-pay

## 2021-10-22 DIAGNOSIS — N898 Other specified noninflammatory disorders of vagina: Secondary | ICD-10-CM | POA: Diagnosis present

## 2021-10-22 DIAGNOSIS — N3000 Acute cystitis without hematuria: Secondary | ICD-10-CM | POA: Insufficient documentation

## 2021-10-22 DIAGNOSIS — N760 Acute vaginitis: Secondary | ICD-10-CM | POA: Diagnosis not present

## 2021-10-22 LAB — URINALYSIS, ROUTINE W REFLEX MICROSCOPIC
Bacteria, UA: NONE SEEN
Bilirubin Urine: NEGATIVE
Glucose, UA: NEGATIVE mg/dL
Hgb urine dipstick: NEGATIVE
Ketones, ur: 20 mg/dL — AB
Nitrite: NEGATIVE
Protein, ur: NEGATIVE mg/dL
Specific Gravity, Urine: 1.016 (ref 1.005–1.030)
WBC, UA: >50 WBC/hpf — ABNORMAL HIGH (ref 0–5)
pH: 6 (ref 5.0–8.0)

## 2021-10-22 LAB — CHLAMYDIA/NGC RT PCR (ARMC ONLY)
Chlamydia Tr: NOT DETECTED
N gonorrhoeae: NOT DETECTED

## 2021-10-22 LAB — WET PREP, GENITAL
Clue Cells Wet Prep HPF POC: NONE SEEN
Sperm: NONE SEEN
Trich, Wet Prep: NONE SEEN
Yeast Wet Prep HPF POC: NONE SEEN

## 2021-10-22 LAB — POC URINE PREG, ED: Preg Test, Ur: NEGATIVE

## 2021-10-22 LAB — CBG MONITORING, ED: Glucose-Capillary: 94 mg/dL (ref 70–99)

## 2021-10-22 MED ORDER — FLUCONAZOLE 150 MG PO TABS: 150.0000 mg | ORAL_TABLET | Freq: Every day | ORAL | 0 refills | Status: AC

## 2021-10-22 MED ORDER — CEPHALEXIN 500 MG PO CAPS: 500.0000 mg | ORAL_CAPSULE | Freq: Two times a day (BID) | ORAL | 0 refills | Status: AC

## 2021-10-22 MED ORDER — IBUPROFEN 600 MG PO TABS: 600.0000 mg | ORAL_TABLET | Freq: Four times a day (QID) | ORAL | 0 refills | Status: AC | PRN

## 2021-10-22 NOTE — ED Triage Notes (Signed)
C/O discharge x 3 days, left hip pain x 3 days.  Denies dysuria ?

## 2021-10-22 NOTE — Discharge Instructions (Signed)
Take a dose of the fluconazole for possible yeast infection and start the antibiotics and then after the antibiotics have completed take another dose of fluconazole and return to the ER for worsening symptoms or any other concerns. ? ? ?

## 2021-10-22 NOTE — ED Provider Notes (Signed)
? ?[facility] ?Provider Note ? ? ? Event Date/Time  ? First MD Initiated Contact with Patient 10/22/21 [phone]   ?  (approximate) ? ? ?History  ? ?Vaginal Discharge ? ? ?HPI ? ?Cheyenne Wise is a 21 y.o. female who comes in with concern for vaginal irritation.  Patient reports symptoms for the past 3 days.  She reports going to the health department but being told that her results would not be available for a few days therefore she came to the ER instead.  She reports a little bit of a brown discharge.  She states that she just wants to make sure that there is no infections going on in her vaginal area.  She denies any abdominal pain.  Patient reports being sexually active with 1 person.  She reports that her last testing for STDs she was already with this partner but she reports having trust issues.  When I asked patient why she stated that she had hip pain for 3 days she reports that that seems to be a separate issue that she feels like she just pulled a muscle.  She denies any swelling in her leg and denies any difficulties ambulating just some increasing pain.  She denies any other concerns ? ? ?I reviewed patient's OB visit from 09/29/2021 for patient had STD testing that was negative ? ?Physical Exam  ? ?Triage Vital Signs: ?ED Triage Vitals  ?Enc Vitals Group  ?   BP 10/22/21 0918 (!) 115/55  ?   Pulse Rate 10/22/21 0918 68  ?   Resp 10/22/21 0918 16  ?   Temp 10/22/21 0918 97.9 ?F (36.6 ?C)  ?   Temp Source 10/22/21 0918 Oral  ?   SpO2 10/22/21 0918 100 %  ?   Weight 10/22/21 0916 139 lb 15.9 oz (63.5 kg)  ?   Height 10/22/21 0916 5\' 5"  (1.651 m)  ?   Head Circumference --   ?   Peak Flow --   ?   Pain Score 10/22/21 0916 6  ?   Pain Loc --   ?   Pain Edu? --   ?   Excl. in [place]? --   ? ? ?Most recent vital signs: ?Vitals:  ? 10/22/21 0918  ?BP: (!) 115/55  ?Pulse: 68  ?Resp: 16  ?Temp: 97.9 ?F (36.6 ?C)  ?SpO2: 100%  ? ? ? ?General: Awake, no distress.  ?CV:  Good peripheral perfusion.   ?Resp:  Normal effort.  ?Abd:  No distention.  Soft and nontender ?Other:  Patient has some whitish discharge noted without any cervical motion tenderness or adnexal tenderness ?Left leg with good distal pulse without any swelling.  She is got range of motion and can still ambulate. ? ?ED Results / Procedures / Treatments  ? ?Labs ?(all labs ordered are listed, but only abnormal results are displayed) ?Labs Reviewed  ?CHLAMYDIA/NGC RT PCR ([facility] ONLY)            ?WET PREP, GENITAL  ?URINALYSIS, ROUTINE W REFLEX MICROSCOPIC  ?POC URINE PREG, ED  ? ? ?MEDICATIONS ORDERED IN ED: ?Medications - No data to display ? ? ?IMPRESSION / MDM / ASSESSMENT AND PLAN / ED COURSE  ?I reviewed the triage vital signs and the nursing notes. ? ? ?Urine evaluate for UTI, pregnancy test, pelvic exam to evaluate for STDs, yeast, bacterial vaginosis.  For patient's left leg I suspect is most likely a leg strain.  Given symptoms for 3 days  will defer x-rays at this time which patient was comfortable with ? ?Abdomen soft and nontender doubt appendicitis.  Patient afebrile well-appearing doubt ovarian torsion or ovarian abscess. ? ?UA looks consistent with UTI.  Given the ketones I did check a glucose which was normal.  Pregnancy test is negative. ? ? ?Exam seems consistent with yeast infection although wet prep is negative I still feel that we should treat patient.  We should give 7 days of antibiotics for UTI and then give another dose of fluconazole after this is completed ? ?FINAL CLINICAL IMPRESSION(S) / ED DIAGNOSES  ? ?Final diagnoses:  ?Acute cystitis without hematuria  ?Acute vaginitis  ? ? ? ?Rx / DC Orders  ? ?ED Discharge Orders   ? ?      Ordered  ?  cephALEXin (KEFLEX) 500 MG capsule  2 times daily       ? 10/22/21 1205  ?  fluconazole (DIFLUCAN) 150 MG tablet  Daily       ? 10/22/21 1205  ?  ibuprofen (ADVIL) 600 MG tablet  Every 6 hours PRN       ? 10/22/21 1205  ? ?  ?  ? ?  ? ? ? ?Note:  This document was prepared using  Dragon voice recognition software and may include unintentional dictation errors. ?  ?Vanessa Midway, MD ?10/22/21 1209 ? ?

## 2021-10-24 LAB — URINE CULTURE: Culture: 60000 — AB

## 2021-11-01 ENCOUNTER — Other Ambulatory Visit: Payer: Self-pay

## 2021-11-01 ENCOUNTER — Encounter: Payer: Self-pay | Admitting: Emergency Medicine

## 2021-11-01 ENCOUNTER — Emergency Department
Admission: EM | Admit: 2021-11-01 | Discharge: 2021-11-01 | Disposition: A | Discharge status: Home / Self Care | Payer: Medicaid Other | Attending: Emergency Medicine | Admitting: Emergency Medicine

## 2021-11-01 DIAGNOSIS — Z113 Encounter for screening for infections with a predominantly sexual mode of transmission: Secondary | ICD-10-CM | POA: Insufficient documentation

## 2021-11-01 DIAGNOSIS — R112 Nausea with vomiting, unspecified: Secondary | ICD-10-CM | POA: Diagnosis present

## 2021-11-01 DIAGNOSIS — K529 Noninfective gastroenteritis and colitis, unspecified: Secondary | ICD-10-CM | POA: Diagnosis not present

## 2021-11-01 LAB — COMPREHENSIVE METABOLIC PANEL
ALT: 15 U/L (ref 0–44)
AST: 12 U/L — ABNORMAL LOW (ref 15–41)
Albumin: 4.2 g/dL (ref 3.5–5.0)
Alkaline Phosphatase: 55 U/L (ref 38–126)
Anion gap: 6 (ref 5–15)
BUN: 10 mg/dL (ref 6–20)
CO2: 24 mmol/L (ref 22–32)
Calcium: 8.9 mg/dL (ref 8.9–10.3)
Chloride: 110 mmol/L (ref 98–111)
Creatinine, Ser: 0.83 mg/dL (ref 0.44–1.00)
GFR, Estimated: >60 mL/min (ref >60)
Glucose, Bld: 101 mg/dL — ABNORMAL HIGH (ref 70–99)
Potassium: 3.5 mmol/L (ref 3.5–5.1)
Sodium: 140 mmol/L (ref 135–145)
Total Bilirubin: 0.5 mg/dL (ref 0.3–1.2)
Total Protein: 7 g/dL (ref 6.5–8.1)

## 2021-11-01 LAB — CBC
HCT: 38.8 % (ref 36.0–46.0)
Hemoglobin: 12.5 g/dL (ref 12.0–15.0)
MCH: 30 pg (ref 26.0–34.0)
MCHC: 32.2 g/dL (ref 30.0–36.0)
MCV: 93 fL (ref 80.0–100.0)
Platelets: 259 10*3/uL (ref 150–400)
RBC: 4.17 MIL/uL (ref 3.87–5.11)
RDW: 12 % (ref 11.5–15.5)
WBC: 6.5 10*3/uL (ref 4.0–10.5)
nRBC: 0 % (ref 0.0–0.2)

## 2021-11-01 LAB — WET PREP, GENITAL
Clue Cells Wet Prep HPF POC: NONE SEEN
Sperm: NONE SEEN
Trich, Wet Prep: NONE SEEN
WBC, Wet Prep HPF POC: <10 (ref <10)
Yeast Wet Prep HPF POC: NONE SEEN

## 2021-11-01 LAB — URINALYSIS, ROUTINE W REFLEX MICROSCOPIC
Bilirubin Urine: NEGATIVE
Glucose, UA: NEGATIVE mg/dL
Hgb urine dipstick: NEGATIVE
Ketones, ur: NEGATIVE mg/dL
Leukocytes,Ua: NEGATIVE
Nitrite: NEGATIVE
Protein, ur: NEGATIVE mg/dL
Specific Gravity, Urine: 1.004 — ABNORMAL LOW (ref 1.005–1.030)
pH: 6 (ref 5.0–8.0)

## 2021-11-01 LAB — POC URINE PREG, ED: Preg Test, Ur: NEGATIVE

## 2021-11-01 LAB — CHLAMYDIA/NGC RT PCR (ARMC ONLY)
Chlamydia Tr: NOT DETECTED
N gonorrhoeae: NOT DETECTED

## 2021-11-01 LAB — LIPASE, BLOOD: Lipase: 31 U/L (ref 11–51)

## 2021-11-01 MED ORDER — ONDANSETRON 4 MG PO TBDP: 4.0000 mg | ORAL_TABLET | Freq: Three times a day (TID) | ORAL | 0 refills | Status: DC | PRN

## 2021-11-01 MED ORDER — ONDANSETRON 4 MG PO TBDP
4.0000 mg | ORAL_TABLET | Freq: Once | ORAL | Status: AC
  Administered 2021-11-01: 4 mg via ORAL
  Filled 2021-11-01: qty 1

## 2021-11-01 NOTE — ED Triage Notes (Addendum)
Pt reports for the last 2 days she will eat, get nauseated and then vomit. Pt denies pain. Pt reports no possibly of being pregnant because hasn't had sex with men, denies all other sx's, just concerned about not being able to keep food down. Pt also states that while she is here she would like to be checked for all the STD's. Pt reports is not having any sx's but wants to be checked.  ?

## 2021-11-01 NOTE — ED Provider Notes (Addendum)
? ? ?[facility] ?Emergency Department Provider Note ? ? ? ? Event Date/Time  ? First MD Initiated Contact with Patient 11/01/21 1120   ?  (approximate) ? ? ?History  ? ?Nausea and Emesis ? ? ?HPI ? ?Cheyenne Wise is a 21 y.o. female with noncontributory medical history, presents to the ED with reports of nausea and vomiting for the last 2 days.  Patient denies any frank upper or lower abdominal discomfort.  She also denies any concern for pregnancy as she is currently on her menses.  She also does not endorse any recent sexual activity with men.  Patient has a second unrelated request for STD evaluation.  She denies any vaginal discharge or dysuria at this time.  Of note, was a chart review encounter noted with the patient was treated for a UTI about a 1 week prior. ? ? ?Physical Exam  ? ?Triage Vital Signs: ?ED Triage Vitals  ?Enc Vitals Group  ?   BP 11/01/21 1105 (!) 117/56  ?   Pulse Rate 11/01/21 1105 69  ?   Resp 11/01/21 1105 18  ?   Temp 11/01/21 1105 98.4 ?F (36.9 ?C)  ?   Temp src --   ?   SpO2 11/01/21 1105 100 %  ?   Weight 11/01/21 1104 145 lb (65.8 kg)  ?   Height 11/01/21 1104 5\' 5"  (1.651 m)  ?   Head Circumference --   ?   Peak Flow --   ?   Pain Score 11/01/21 1104 0  ?   Pain Loc --   ?   Pain Edu? --   ?   Excl. in [place]? --   ? ? ?Most recent vital signs: ?Vitals:  ? 11/01/21 1105  ?BP: (!) 117/56  ?Pulse: 69  ?Resp: 18  ?Temp: 98.4 ?F (36.9 ?C)  ?SpO2: 100%  ? ? ?General Awake, no distress.  ?CV:  Good peripheral perfusion.  ?RESP:  Normal effort.  ?ABD:  No distention.  ? ?ED Results / Procedures / Treatments  ? ?Labs ?(all labs ordered are listed, but only abnormal results are displayed) ?Labs Reviewed  ?COMPREHENSIVE METABOLIC PANEL - Abnormal; Notable for the following components:  ?    Result Value  ? Glucose, Bld 101 (*)   ? AST 12 (*)   ? All other components within normal limits  ?URINALYSIS, ROUTINE W REFLEX MICROSCOPIC - Abnormal; Notable for the following  components:  ? Color, Urine STRAW (*)   ? APPearance CLEAR (*)   ? Specific Gravity, Urine 1.004 (*)   ? All other components within normal limits  ?POC URINE PREG, ED - Normal  ?WET PREP, GENITAL  ?CHLAMYDIA/NGC RT PCR ([facility] ONLY)            ?LIPASE, BLOOD  ?CBC  ? ? ? ?EKG ? ? ? ?RADIOLOGY ? ? ?No results found. ? ? ?PROCEDURES: ? ?Critical Care performed: No ? ?Procedures ? ? ?MEDICATIONS ORDERED IN ED: ?Medications  ?ondansetron (ZOFRAN-ODT) disintegrating tablet 4 mg (4 mg Oral Given 11/01/21 1224)  ? ? ? ?IMPRESSION / MDM / ASSESSMENT AND PLAN / ED COURSE  ?I reviewed the triage vital signs and the nursing notes. ?             ?               ? ?Differential diagnosis includes, but is not limited to, biliary disease (biliary colic, acute cholecystitis, cholangitis, choledocholithiasis, etc), intrathoracic  causes for epigastric abdominal pain including ACS, gastritis, duodenitis, pancreatitis, small bowel or large bowel obstruction, abdominal aortic aneurysm, hernia, and ulcer(s). ? ? ?Patient to the ED for evaluation of intermittent episodes of nausea and vomiting.  She presents in no acute distress without signs of dehydration or toxic appearance.  Labs are reassuring overall without any acute leukocytosis, atrial abnormality, or evidence of UTI.  Patient's wet prep is also negative and GC is pending at this time without Neuroform concern for infection.  Patient's diagnosis is consistent with nausea and vomiting likely viral etiology. Patient will be discharged home with prescriptions for Zofran. Patient is to follow up with [place] [facility] as needed or otherwise directed. Patient is given ED precautions to return to the ED for any worsening or new symptoms. ? ? ?FINAL CLINICAL IMPRESSION(S) / ED DIAGNOSES  ? ?Final diagnoses:  ?Gastroenteritis  ?Screening for STD (sexually transmitted disease)  ? ? ? ?Rx / DC Orders  ? ?ED Discharge Orders   ? ?      Ordered  ?  ondansetron (ZOFRAN-ODT) 4 MG  disintegrating tablet  Every 8 hours PRN       ? 11/01/21 1258  ? ?  ?  ? ?  ? ? ? ?Note:  This document was prepared using Dragon voice recognition software and may include unintentional dictation errors. ? ?  ?Melvenia Needles, PA-C ?11/01/21 1345 ? ?  ?Melvenia Needles, PA-C ?11/01/21 1345 ? ?  ?Rada Hay, MD ?11/01/21 1426 ? ?

## 2021-11-01 NOTE — Discharge Instructions (Signed)
Your exam and labs are normal at this time.  No signs of a serious infection or dehydration.  Your symptoms may be due to a viral cause including food poisoning.  Take the nausea medicine as needed.  Continue to drink to prevent dehydration.  Follow-up with primary provider return to the ED if needed. ?

## 2021-11-01 NOTE — ED Notes (Signed)
Pt to ED for N/V since 2 days. States unable to hold down food, fluids. Pt in NAD, denies abdominal pain. Denies abnormal vaginal discharge or pain, but states she has a new partner and "wants to get tested for everything".  ?

## 2021-12-01 ENCOUNTER — Other Ambulatory Visit: Payer: Self-pay

## 2021-12-01 ENCOUNTER — Emergency Department
Admission: EM | Admit: 2021-12-01 | Discharge: 2021-12-01 | Disposition: A | Discharge status: Home / Self Care | Payer: Medicaid Other | Attending: Emergency Medicine | Admitting: Emergency Medicine

## 2021-12-01 ENCOUNTER — Encounter: Payer: Self-pay | Admitting: Emergency Medicine

## 2021-12-01 DIAGNOSIS — M545 Low back pain, unspecified: Secondary | ICD-10-CM | POA: Diagnosis present

## 2021-12-01 DIAGNOSIS — A5901 Trichomonal vulvovaginitis: Secondary | ICD-10-CM | POA: Diagnosis not present

## 2021-12-01 DIAGNOSIS — N760 Acute vaginitis: Secondary | ICD-10-CM | POA: Insufficient documentation

## 2021-12-01 DIAGNOSIS — B9689 Other specified bacterial agents as the cause of diseases classified elsewhere: Secondary | ICD-10-CM | POA: Diagnosis not present

## 2021-12-01 DIAGNOSIS — A599 Trichomoniasis, unspecified: Secondary | ICD-10-CM

## 2021-12-01 LAB — URINALYSIS, ROUTINE W REFLEX MICROSCOPIC
Bilirubin Urine: NEGATIVE
Glucose, UA: NEGATIVE mg/dL
Hgb urine dipstick: NEGATIVE
Ketones, ur: 5 mg/dL — AB
Leukocytes,Ua: NEGATIVE
Nitrite: NEGATIVE
Protein, ur: NEGATIVE mg/dL
Specific Gravity, Urine: 1.025 (ref 1.005–1.030)
pH: 5 (ref 5.0–8.0)

## 2021-12-01 LAB — WET PREP, GENITAL
Clue Cells Wet Prep HPF POC: NEGATIVE — AB
Sperm: NONE SEEN
Trich, Wet Prep: NEGATIVE — AB
WBC, Wet Prep HPF POC: <10 (ref <10)
Yeast Wet Prep HPF POC: NEGATIVE — AB

## 2021-12-01 LAB — CHLAMYDIA/NGC RT PCR (ARMC ONLY)
Chlamydia Tr: NOT DETECTED
N gonorrhoeae: NOT DETECTED

## 2021-12-01 MED ORDER — METRONIDAZOLE 500 MG PO TABS: 500.0000 mg | ORAL_TABLET | Freq: Two times a day (BID) | ORAL | 0 refills | Status: AC

## 2021-12-01 MED ORDER — IBUPROFEN 600 MG PO TABS: 600.0000 mg | ORAL_TABLET | Freq: Once | ORAL | Status: DC

## 2021-12-01 MED ORDER — DOXYCYCLINE HYCLATE 100 MG PO CAPS: 100.0000 mg | ORAL_CAPSULE | Freq: Two times a day (BID) | ORAL | 0 refills | Status: AC

## 2021-12-01 MED ORDER — ONDANSETRON 4 MG PO TBDP: 4.0000 mg | ORAL_TABLET | Freq: Three times a day (TID) | ORAL | 0 refills | Status: DC | PRN

## 2021-12-01 MED ORDER — METRONIDAZOLE 500 MG PO TABS
2000.0000 mg | ORAL_TABLET | Freq: Once | ORAL | Status: AC
  Administered 2021-12-01: 2000 mg via ORAL
  Filled 2021-12-01: qty 4

## 2021-12-01 MED ORDER — CEFTRIAXONE SODIUM 250 MG IJ SOLR
250.0000 mg | Freq: Once | INTRAMUSCULAR | Status: AC
  Administered 2021-12-01: 250 mg via INTRAMUSCULAR
  Filled 2021-12-01: qty 250

## 2021-12-01 MED ORDER — ONDANSETRON 4 MG PO TBDP
4.0000 mg | ORAL_TABLET | Freq: Once | ORAL | Status: AC
  Administered 2021-12-01: 4 mg via ORAL
  Filled 2021-12-01: qty 1

## 2021-12-01 MED ORDER — DOXYCYCLINE HYCLATE 100 MG PO TABS
100.0000 mg | ORAL_TABLET | Freq: Once | ORAL | Status: AC
  Administered 2021-12-01: 100 mg via ORAL
  Filled 2021-12-01: qty 1

## 2021-12-01 MED ORDER — IBUPROFEN 600 MG PO TABS: 600.0000 mg | ORAL_TABLET | Freq: Three times a day (TID) | ORAL | 0 refills | Status: AC | PRN

## 2021-12-01 NOTE — ED Notes (Signed)
See triage note  Presents with lower back pain  States pain started couple of days ago   denies any injury or urinary sx's  ambulates well

## 2021-12-01 NOTE — ED Triage Notes (Signed)
C/o low back pain x 1 day.  Denies injury.  Has not taken anything for pain.  AAOx3.  Skin warm and dry. Gait steady. Posture upright and relaxed.

## 2021-12-01 NOTE — ED Provider Notes (Signed)
[facility] Provider Note    Event Date/Time   First MD Initiated Contact with Patient 12/01/21 1350     (approximate)   History   Back Pain   HPI  Cheyenne Wise is a 21 y.o. female here with dysuria, dark urine, and lower back pain.  The patient states that over the last 2 days ago she has had progressively worsening urinary frequency, dark urine.  She has a history of UTIs with similar symptoms.  She is also had some aching, throbbing, lower back pain.  This is similar to her previous UTIs.  Patient also has a history of STDs and like to be tested for this.  Denies any overt dyspareunia.  No vaginal discharge or bleeding.  No fevers or chills.  No weight loss or night sweats.  No rash.     Physical Exam   Triage Vital Signs: ED Triage Vitals  Enc Vitals Group     BP 12/01/21 1252 128/90     Pulse Rate 12/01/21 1252 95     Resp 12/01/21 1252 18     Temp 12/01/21 1252 98 F (36.7 C)     Temp Source 12/01/21 1252 Oral     SpO2 12/01/21 1252 99 %     Weight 12/01/21 1256 135 lb (61.2 kg)     Height 12/01/21 1256 5\' 5"  (1.651 m)     Head Circumference --      Peak Flow --      Pain Score 12/01/21 1255 7     Pain Loc --      Pain Edu? --      Excl. in [place]? --     Most recent vital signs: Vitals:   12/01/21 1252  BP: 128/90  Pulse: 95  Resp: 18  Temp: 98 F (36.7 C)  SpO2: 99%     General: Awake, no distress.  CV:  Good peripheral perfusion.  Resp:  Normal effort.  Abd:  No distention.  Mild suprapubic tenderness.  No guarding or rebound. Other:  Moderate amount vaginal discharge.  Slight cervical tenderness, no adnexal fullness or tenderness.   ED Results / Procedures / Treatments   Labs (all labs ordered are listed, but only abnormal results are displayed) Labs Reviewed  WET PREP, GENITAL - Abnormal; Notable for the following components:      Result Value   Yeast Wet Prep HPF POC NEGATIVE (*)    Trich, Wet Prep NEGATIVE (*)     Clue Cells Wet Prep HPF POC NEGATIVE (*)    All other components within normal limits  URINALYSIS, ROUTINE W REFLEX MICROSCOPIC - Abnormal; Notable for the following components:   Color, Urine YELLOW (*)    APPearance HAZY (*)    Ketones, ur 5 (*)    All other components within normal limits  CHLAMYDIA/NGC RT PCR ([facility] ONLY)            POC URINE PREG, ED     EKG    RADIOLOGY     PROCEDURES:  Critical Care performed: No   MEDICATIONS ORDERED IN ED: Medications  cefTRIAXone (ROCEPHIN) injection 250 mg (250 mg Intramuscular Given 12/01/21 1549)  doxycycline (VIBRA-TABS) tablet 100 mg (100 mg Oral Given 12/01/21 1549)  ondansetron (ZOFRAN-ODT) disintegrating tablet 4 mg (4 mg Oral Given 12/01/21 1553)  metroNIDAZOLE (FLAGYL) tablet 2,000 mg (2,000 mg Oral Given 12/01/21 1548)     IMPRESSION / MDM / ASSESSMENT AND PLAN / ED COURSE  I  reviewed the triage vital signs and the nursing notes.                               Ddx:  Differential includes the following, with pertinent life- or limb-threatening emergencies considered:  UTI, PID, cervicitis, atypical colitis, vaginitis  Patient's presentation is most consistent with acute illness / injury with system symptoms.  MDM:  21 yo F here with suprapubic and lower back pain. Initial concern for UTI, though UA shows no pyuria. Exam as above, pt has moderate CMT though not significant. No fevers or signs of sepsis/systemic illness. Wet prep + trich, BV. Will tx for trich and possible PID. Safe sex practices discussed, as well as safe/appropriate washing of sex toys. Return precautions given.    MEDICATIONS GIVEN IN ED: Medications  cefTRIAXone (ROCEPHIN) injection 250 mg (250 mg Intramuscular Given 12/01/21 1549)  doxycycline (VIBRA-TABS) tablet 100 mg (100 mg Oral Given 12/01/21 1549)  ondansetron (ZOFRAN-ODT) disintegrating tablet 4 mg (4 mg Oral Given 12/01/21 1553)  metroNIDAZOLE (FLAGYL) tablet 2,000 mg (2,000 mg Oral  Given 12/01/21 1548)     Consults:  None   EMR reviewed       FINAL CLINICAL IMPRESSION(S) / ED DIAGNOSES   Final diagnoses:  Trichomonas infection  Bacterial vaginosis     Rx / DC Orders   ED Discharge Orders          Ordered    doxycycline (VIBRAMYCIN) 100 MG capsule  2 times daily        12/01/21 1539    metroNIDAZOLE (FLAGYL) 500 MG tablet  2 times daily        12/01/21 1539    ibuprofen (ADVIL) 600 MG tablet  Every 8 hours PRN        12/01/21 1539    ondansetron (ZOFRAN-ODT) 4 MG disintegrating tablet  Every 8 hours PRN        12/01/21 1539             Note:  This document was prepared using Dragon voice recognition software and may include unintentional dictation errors.   Duffy Bruce, MD 12/01/21 1718

## 2021-12-01 NOTE — ED Triage Notes (Signed)
Pt here with cervical back pain x1 day. Pt denies injury or radiation. Pt denies N/V/D.

## 2022-01-25 ENCOUNTER — Other Ambulatory Visit: Payer: Self-pay

## 2022-01-25 ENCOUNTER — Encounter: Payer: Self-pay | Admitting: Emergency Medicine

## 2022-01-25 ENCOUNTER — Emergency Department
Admission: EM | Admit: 2022-01-25 | Discharge: 2022-01-25 | Disposition: A | Discharge status: Home / Self Care | Payer: Medicaid Other | Attending: Emergency Medicine | Admitting: Emergency Medicine

## 2022-01-25 DIAGNOSIS — N760 Acute vaginitis: Secondary | ICD-10-CM | POA: Insufficient documentation

## 2022-01-25 DIAGNOSIS — R059 Cough, unspecified: Secondary | ICD-10-CM | POA: Diagnosis present

## 2022-01-25 DIAGNOSIS — U071 COVID-19: Secondary | ICD-10-CM

## 2022-01-25 DIAGNOSIS — B9689 Other specified bacterial agents as the cause of diseases classified elsewhere: Secondary | ICD-10-CM | POA: Diagnosis not present

## 2022-01-25 LAB — CHLAMYDIA/NGC RT PCR (ARMC ONLY)
Chlamydia Tr: NOT DETECTED
N gonorrhoeae: NOT DETECTED

## 2022-01-25 LAB — RESP PANEL BY RT-PCR (FLU A&B, COVID) ARPGX2
Influenza A by PCR: NEGATIVE
Influenza B by PCR: NEGATIVE
SARS Coronavirus 2 by RT PCR: POSITIVE — AB

## 2022-01-25 LAB — WET PREP, GENITAL
Sperm: NONE SEEN
Trich, Wet Prep: NONE SEEN
WBC, Wet Prep HPF POC: <10 (ref <10)
Yeast Wet Prep HPF POC: NONE SEEN

## 2022-01-25 NOTE — ED Notes (Signed)
See triage note. Pt adds nausea and vomiting to complaints. Pt has been able to drink and hold down fluids but vomited after eating breakfast this morning. Has been feverish but has not measured fever at home. Pt is also request testing for an STI to follow up on a previous Dx of trichamonas vaginalis. Pt A&Ox4, NAD.

## 2022-01-25 NOTE — ED Triage Notes (Signed)
Pt reports chills, bodyaches, cough and congestion for the last few days.

## 2022-01-25 NOTE — Discharge Instructions (Signed)
Please return for any new, worsening, or change in symptoms or other concerns.  Please remember that you are highly contagious to others.  You may continue to take Tylenol/ibuprofen per package instructions as needed for fever or body aches.

## 2022-01-25 NOTE — ED Provider Notes (Signed)
[facility] Provider Note    Event Date/Time   First MD Initiated Contact with Patient 01/25/22 1754     (approximate)   History   Chills, Generalized Body Aches, and Cough   HPI  Cheyenne Wise is a 21 y.o. female who presents today for evaluation of cough, runny nose, body aches for 2 days.  Patient reports that she works in [employer], but cannot identify any specific sick contacts.  She denies abdominal pain, dysuria, or vaginal discharge.  She reports that she was recently treated for trichomonas and would like to be tested again to make sure that it has cleared.  Reports that she is not sexually active currently, and is sexually active with females only.     Physical Exam   Triage Vital Signs: ED Triage Vitals  Enc Vitals Group     BP 01/25/22 1733 133/81     Pulse Rate 01/25/22 1733 64     Resp 01/25/22 1733 20     Temp 01/25/22 1733 99.2 F (37.3 C)     Temp Source 01/25/22 1733 Oral     SpO2 01/25/22 1733 98 %     Weight 01/25/22 1732 135 lb (61.2 kg)     Height 01/25/22 1732 5\' 5"  (1.651 m)     Head Circumference --      Peak Flow --      Pain Score 01/25/22 1732 7     Pain Loc --      Pain Edu? --      Excl. in GC? --     Most recent vital signs: Vitals:   01/25/22 1733  BP: 133/81  Pulse: 64  Resp: 20  Temp: 99.2 F (37.3 C)  SpO2: 98%    Physical Exam Vitals and nursing note reviewed.  Constitutional:      General: Awake and alert. No acute distress.    Appearance: Normal appearance. The patient is normal weight.  HENT:     Head: Normocephalic and atraumatic.     Mouth: Mucous membranes are moist.  Nasal congestion and rhinorrhea present Eyes:     General: PERRL. Normal EOMs        Right eye: No discharge.        Left eye: No discharge.     Conjunctiva/sclera: Conjunctivae normal.  Cardiovascular:     Rate and Rhythm: Normal rate and regular rhythm.     Pulses: Normal pulses.     Heart sounds: Normal  heart sounds Pulmonary:     Effort: Pulmonary effort is normal. No respiratory distress.  Able to speak easily in complete sentences    Breath sounds: Normal breath sounds.  Abdominal:     Abdomen is soft. There is no abdominal tenderness. No rebound or guarding. No distention. Declined pelvic exam Musculoskeletal:        General: No swelling. Normal range of motion.     Cervical back: Normal range of motion and neck supple.  Skin:    General: Skin is warm and dry.     Capillary Refill: Capillary refill takes less than 2 seconds.     Findings: No rash.  Neurological:     Mental Status: The patient is awake and alert.      ED Results / Procedures / Treatments   Labs (all labs ordered are listed, but only abnormal results are displayed) Labs Reviewed  RESP PANEL BY RT-PCR (FLU A&B, COVID) ARPGX2 - Abnormal; Notable for the following  components:      Result Value   SARS Coronavirus 2 by RT PCR POSITIVE (*)    All other components within normal limits  WET PREP, GENITAL - Abnormal; Notable for the following components:   Clue Cells Wet Prep HPF POC PRESENT (*)    All other components within normal limits  CHLAMYDIA/NGC RT PCR ([facility] ONLY)            PREGNANCY, URINE  POC URINE PREG, ED     EKG     RADIOLOGY     PROCEDURES:  Critical Care performed:   Procedures   MEDICATIONS ORDERED IN ED: Medications - No data to display   IMPRESSION / MDM / ASSESSMENT AND PLAN / ED COURSE  I reviewed the triage vital signs and the nursing notes.   Differential diagnosis includes, but is not limited to, covid-19, influenza, URI trichomonas, STD. No dysuria to suggest UTI.  Patient is awake and alert, hemodynamically stable and afebrile.  Symptoms of nasal congestion, cough, body aches, chills consistent with COVID-19/influenza, and COVID/flu swab obtained and is positive for COVID-19.  Patient has a normal oxygen saturation on room air.  She demonstrates no increased work of  breathing. Patient self swabbed to assess for clearance of her recent trichomonas infection.  She declined pelvic exam.  Trichomonas is negative, however swab did reveal bacterial vaginosis.  Patient reports that she is aware of this, and does not wish to be treated given that she is on a maintenance medication for it.  She denies concern for STDs and did not wish to wait for her gonorrhea/chlamydia result.  We discussed isolation instructions and return precautions.  She was given a work note as requested.  We discussed symptomatic management.  All questions were answered, patient was discharged in stable condition.     Patient's presentation is most consistent with acute complicated illness / injury requiring diagnostic workup.    FINAL CLINICAL IMPRESSION(S) / ED DIAGNOSES   Final diagnoses:  COVID-19  Bacterial vaginosis     Rx / DC Orders   ED Discharge Orders     None        Note:  This document was prepared using Dragon voice recognition software and may include unintentional dictation errors.   [name] 01/25/22 2208    Sharyn Creamer, MD 01/27/22 Cheyenne Wise

## 2022-01-25 NOTE — ED Notes (Signed)
Pt self swabbing at this time

## 2022-02-22 ENCOUNTER — Other Ambulatory Visit: Payer: Self-pay

## 2022-02-22 ENCOUNTER — Emergency Department
Admission: EM | Admit: 2022-02-22 | Discharge: 2022-02-22 | Disposition: A | Discharge status: Home / Self Care | Payer: Medicaid Other | Attending: Emergency Medicine | Admitting: Emergency Medicine

## 2022-02-22 ENCOUNTER — Encounter: Payer: Self-pay | Admitting: Emergency Medicine

## 2022-02-22 DIAGNOSIS — R350 Frequency of micturition: Secondary | ICD-10-CM | POA: Diagnosis not present

## 2022-02-22 DIAGNOSIS — R300 Dysuria: Secondary | ICD-10-CM | POA: Insufficient documentation

## 2022-02-22 DIAGNOSIS — R1030 Lower abdominal pain, unspecified: Secondary | ICD-10-CM | POA: Diagnosis not present

## 2022-02-22 LAB — URINALYSIS, ROUTINE W REFLEX MICROSCOPIC
Specific Gravity, Urine: 1.019 (ref 1.005–1.030)
Squamous Epithelial / HPF: NONE SEEN (ref 0–5)

## 2022-02-22 LAB — COMPREHENSIVE METABOLIC PANEL
ALT: 9 U/L (ref 0–44)
AST: 11 U/L — ABNORMAL LOW (ref 15–41)
Albumin: 4.3 g/dL (ref 3.5–5.0)
Alkaline Phosphatase: 58 U/L (ref 38–126)
Anion gap: 3 — ABNORMAL LOW (ref 5–15)
BUN: 9 mg/dL (ref 6–20)
CO2: 27 mmol/L (ref 22–32)
Calcium: 9.2 mg/dL (ref 8.9–10.3)
Chloride: 110 mmol/L (ref 98–111)
Creatinine, Ser: 0.8 mg/dL (ref 0.44–1.00)
GFR, Estimated: >60 mL/min (ref >60)
Glucose, Bld: 98 mg/dL (ref 70–99)
Potassium: 3.6 mmol/L (ref 3.5–5.1)
Sodium: 140 mmol/L (ref 135–145)
Total Bilirubin: 0.6 mg/dL (ref 0.3–1.2)
Total Protein: 7 g/dL (ref 6.5–8.1)

## 2022-02-22 LAB — POC URINE PREG, ED: Preg Test, Ur: NEGATIVE

## 2022-02-22 LAB — CBC
HCT: 39.9 % (ref 36.0–46.0)
Hemoglobin: 12.3 g/dL (ref 12.0–15.0)
MCH: 29.4 pg (ref 26.0–34.0)
MCHC: 30.8 g/dL (ref 30.0–36.0)
MCV: 95.5 fL (ref 80.0–100.0)
Platelets: 231 10*3/uL (ref 150–400)
RBC: 4.18 MIL/uL (ref 3.87–5.11)
RDW: 12.2 % (ref 11.5–15.5)
WBC: 7.3 10*3/uL (ref 4.0–10.5)
nRBC: 0 % (ref 0.0–0.2)

## 2022-02-22 LAB — CHLAMYDIA/NGC RT PCR (ARMC ONLY)
Chlamydia Tr: NOT DETECTED
N gonorrhoeae: NOT DETECTED

## 2022-02-22 LAB — LIPASE, BLOOD: Lipase: 31 U/L (ref 11–51)

## 2022-02-22 MED ORDER — SULFAMETHOXAZOLE-TRIMETHOPRIM 800-160 MG PO TABS: 1.0000 | ORAL_TABLET | Freq: Two times a day (BID) | ORAL | 0 refills | Status: AC

## 2022-02-22 MED ORDER — SULFAMETHOXAZOLE-TRIMETHOPRIM 800-160 MG PO TABS
1.0000 | ORAL_TABLET | Freq: Once | ORAL | Status: AC
  Administered 2022-02-22: 1 via ORAL
  Filled 2022-02-22: qty 1

## 2022-02-22 NOTE — Discharge Instructions (Addendum)
Take antibiotics as prescribed.   Thank you for choosing us for your health care today!  Please see your primary doctor this week for a follow up appointment.   If you do not have a primary doctor call the following clinics to establish care:  If you have insurance:  [facility] [phone] [address] [place] [address]   [facility]  [phone] [address] [place] [address]   If you do not have insurance:  [facility]  [phone] [address] [place] [address]  Sometimes, in the early stages of certain disease courses it is difficult to detect in the emergency department evaluation -- so, it is important that you continue to monitor your symptoms and call your doctor right away or return to the emergency department if you develop any new or worsening symptoms.  It was my pleasure to care for you today.   Larenzo Caples S. Marcellino Fidalgo, MD  

## 2022-02-22 NOTE — ED Provider Notes (Signed)
[facility] Provider Note    Event Date/Time   First MD Initiated Contact with Patient 02/22/22 1307     (approximate)   History   Abdominal Pain, Urinary Frequency, and Dysuria   HPI  Cheyenne Wise is a 21 y.o. female   Pleasant 21 year old female with no significant past medical history presents with dysuria crampy lower abdominal pain, frequency.  No vaginal discharge, no changes in bowel movements.  Had no fever, chills, nausea or vomiting.  She requests STI testing.  History was obtained via patient and review of external medical notes.      Physical Exam   Triage Vital Signs: ED Triage Vitals  Enc Vitals Group     BP 02/22/22 1245 (!) 119/59     Pulse Rate 02/22/22 1245 74     Resp 02/22/22 1245 20     Temp 02/22/22 1245 98.9 F (37.2 C)     Temp Source 02/22/22 1245 Oral     SpO2 02/22/22 1245 100 %     Weight 02/22/22 1243 129 lb (58.5 kg)     Height 02/22/22 1243 5\' 5"  (1.651 m)     Head Circumference --      Peak Flow --      Pain Score 02/22/22 1243 5     Pain Loc --      Pain Edu? --      Excl. in GC? --     Most recent vital signs: Vitals:   02/22/22 1245  BP: (!) 119/59  Pulse: 74  Resp: 20  Temp: 98.9 F (37.2 C)  SpO2: 100%    General: Awake, no distress.  CV:  Good peripheral perfusion.  Resp:  Normal effort.  Abd:  No distention.  Nontender to deep palpation in all quadrants.  No CVA tenderness.  No rigidity or guarding. Other:  Overall well-appearing nontoxic and comfortable.   ED Results / Procedures / Treatments   Labs (all labs ordered are listed, but only abnormal results are displayed) Labs Reviewed  COMPREHENSIVE METABOLIC PANEL - Abnormal; Notable for the following components:      Result Value   AST 11 (*)    Anion gap 3 (*)    All other components within normal limits  URINALYSIS, ROUTINE W REFLEX MICROSCOPIC - Abnormal; Notable for the following components:   Color, Urine ORANGE  (*)    APPearance CLEAR (*)    Glucose, UA   (*)    Value: TEST NOT REPORTED DUE TO COLOR INTERFERENCE OF URINE PIGMENT   Hgb urine dipstick   (*)    Value: TEST NOT REPORTED DUE TO COLOR INTERFERENCE OF URINE PIGMENT   Bilirubin Urine   (*)    Value: TEST NOT REPORTED DUE TO COLOR INTERFERENCE OF URINE PIGMENT   Ketones, ur   (*)    Value: TEST NOT REPORTED DUE TO COLOR INTERFERENCE OF URINE PIGMENT   Protein, ur   (*)    Value: TEST NOT REPORTED DUE TO COLOR INTERFERENCE OF URINE PIGMENT   Nitrite   (*)    Value: TEST NOT REPORTED DUE TO COLOR INTERFERENCE OF URINE PIGMENT   Leukocytes,Ua   (*)    Value: TEST NOT REPORTED DUE TO COLOR INTERFERENCE OF URINE PIGMENT   Bacteria, UA RARE (*)    All other components within normal limits  CHLAMYDIA/NGC RT PCR ([facility] ONLY)            URINE CULTURE  LIPASE, BLOOD  CBC  POC URINE PREG, ED     I reviewed labs and they are notable for negative chlamydia and gonorrhea testing, no leukocytosis, and a urinalysis which had rare bacteria   PROCEDURES:  Critical Care performed: No  Procedures   MEDICATIONS ORDERED IN ED: Medications  sulfamethoxazole-trimethoprim (BACTRIM DS) 800-160 MG per tablet 1 tablet (1 tablet Oral Given 02/22/22 1541)     IMPRESSION / MDM / ASSESSMENT AND PLAN / ED COURSE  I reviewed the triage vital signs and the nursing notes.                              Differential diagnosis includes, but is not limited to, urinary tract infection, menstrual cramping, considered but less likely TOA, ovarian torsion, appendicitis, STIs.  MDM: Patient very well-appearing with a very benign abdominal exam, with a largely unremarkable work-up including STI testing, urinalysis which was negative for UTI but was limited in that lab was unable to read due to discoloration.  Given her symptoms consistent with urinary tract infection including frequency, dysuria, will start her on a course of Bactrim for urinary tract infection  and send urine culture and have her follow-up with PMD.  Considered other more emergent causes of lower abdominal cramping pain including TOA, torsion, appendicitis but given a very benign abdominal exam, a repeat abdominal exam which remained unchanged, I spoke with the patient and shared decision making and we chose to defer further work-up and testing  -considered observation for serial abdominal exams as well, but deferred in elected to have a staged discharge process whereby she will return to the emergency department with any worsening or progression of symptoms.    Patient's presentation is most consistent with acute complicated illness / injury requiring diagnostic workup.       FINAL CLINICAL IMPRESSION(S) / ED DIAGNOSES   Final diagnoses:  Dysuria     Rx / DC Orders   ED Discharge Orders          Ordered    sulfamethoxazole-trimethoprim (BACTRIM DS) 800-160 MG tablet  2 times daily        02/22/22 1531             Note:  This document was prepared using Dragon voice recognition software and may include unintentional dictation errors.    Pilar Jarvis, MD 02/22/22 [phone]

## 2022-02-22 NOTE — ED Triage Notes (Signed)
Pt reports pain to her lower abd crampy in nature for the last 2 days. Pt also reports some urinary urgency, frequency and dysuria. Denies vaginal d/c, reports last BM was today and normal.

## 2022-02-25 LAB — URINE CULTURE: Culture: 50000 — AB

## 2022-02-26 NOTE — Consult Note (Signed)
Urine culture returned positive for 50K collonies of E.coli. The pathogen was resistant to bactrim, which the patient was discharged on. Case discussed with Dr. Modesto Charon. Prescription for Nitrofurantoin 100 mg BID x 5 days. Patient was notified and called prescription into [facility] in [place].   Thanks,  Paschal Dopp, PharmD, BCPS

## 2022-03-15 ENCOUNTER — Emergency Department
Admission: EM | Admit: 2022-03-15 | Discharge: 2022-03-15 | Disposition: A | Discharge status: Home / Self Care | Payer: Medicaid Other | Attending: Emergency Medicine | Admitting: Emergency Medicine

## 2022-03-15 ENCOUNTER — Other Ambulatory Visit: Payer: Self-pay

## 2022-03-15 ENCOUNTER — Encounter: Payer: Self-pay | Admitting: Emergency Medicine

## 2022-03-15 ENCOUNTER — Emergency Department: Payer: Medicaid Other

## 2022-03-15 DIAGNOSIS — N3001 Acute cystitis with hematuria: Secondary | ICD-10-CM | POA: Insufficient documentation

## 2022-03-15 DIAGNOSIS — R1030 Lower abdominal pain, unspecified: Secondary | ICD-10-CM | POA: Diagnosis present

## 2022-03-15 LAB — COMPREHENSIVE METABOLIC PANEL
ALT: 9 U/L (ref 0–44)
AST: 12 U/L — ABNORMAL LOW (ref 15–41)
Albumin: 4.3 g/dL (ref 3.5–5.0)
Alkaline Phosphatase: 63 U/L (ref 38–126)
Anion gap: 5 (ref 5–15)
BUN: 10 mg/dL (ref 6–20)
CO2: 27 mmol/L (ref 22–32)
Calcium: 9.3 mg/dL (ref 8.9–10.3)
Chloride: 108 mmol/L (ref 98–111)
Creatinine, Ser: 0.81 mg/dL (ref 0.44–1.00)
GFR, Estimated: >60 mL/min (ref >60)
Glucose, Bld: 109 mg/dL — ABNORMAL HIGH (ref 70–99)
Potassium: 4 mmol/L (ref 3.5–5.1)
Sodium: 140 mmol/L (ref 135–145)
Total Bilirubin: 0.6 mg/dL (ref 0.3–1.2)
Total Protein: 6.9 g/dL (ref 6.5–8.1)

## 2022-03-15 LAB — CBC
HCT: 39.9 % (ref 36.0–46.0)
Hemoglobin: 12.8 g/dL (ref 12.0–15.0)
MCH: 30.4 pg (ref 26.0–34.0)
MCHC: 32.1 g/dL (ref 30.0–36.0)
MCV: 94.8 fL (ref 80.0–100.0)
Platelets: 260 10*3/uL (ref 150–400)
RBC: 4.21 MIL/uL (ref 3.87–5.11)
RDW: 12.6 % (ref 11.5–15.5)
WBC: 11.6 10*3/uL — ABNORMAL HIGH (ref 4.0–10.5)
nRBC: 0 % (ref 0.0–0.2)

## 2022-03-15 LAB — URINALYSIS, ROUTINE W REFLEX MICROSCOPIC
Specific Gravity, Urine: 1.008 (ref 1.005–1.030)
WBC, UA: >50 WBC/hpf — ABNORMAL HIGH (ref 0–5)

## 2022-03-15 LAB — POC URINE PREG, ED: Preg Test, Ur: NEGATIVE

## 2022-03-15 LAB — CHLAMYDIA/NGC RT PCR (ARMC ONLY)
Chlamydia Tr: NOT DETECTED
N gonorrhoeae: NOT DETECTED

## 2022-03-15 LAB — WET PREP, GENITAL
Clue Cells Wet Prep HPF POC: NONE SEEN
Sperm: NONE SEEN
Trich, Wet Prep: NONE SEEN
WBC, Wet Prep HPF POC: <10 (ref <10)
Yeast Wet Prep HPF POC: NONE SEEN

## 2022-03-15 LAB — LIPASE, BLOOD: Lipase: 32 U/L (ref 11–51)

## 2022-03-15 LAB — RAPID HIV SCREEN (HIV 1/2 AB+AG)
HIV 1/2 Antibodies: NONREACTIVE
HIV-1 P24 Antigen - HIV24: NONREACTIVE

## 2022-03-15 LAB — HIV ANTIBODY (ROUTINE TESTING W REFLEX): HIV Screen 4th Generation wRfx: NONREACTIVE

## 2022-03-15 MED ORDER — CEFDINIR 300 MG PO CAPS: 300.0000 mg | ORAL_CAPSULE | Freq: Two times a day (BID) | ORAL | 0 refills | Status: AC

## 2022-03-15 MED ORDER — IOHEXOL 300 MG/ML  SOLN
100.0000 mL | Freq: Once | INTRAMUSCULAR | Status: AC | PRN
  Administered 2022-03-15: 100 mL via INTRAVENOUS

## 2022-03-15 NOTE — ED Triage Notes (Signed)
Pt reports pain to her lower abd that started this am. Pt denies NVD, reports crampy in nature.

## 2022-03-15 NOTE — ED Provider Notes (Signed)
[facility] Provider Note    Event Date/Time   First MD Initiated Contact with Patient 03/15/22 [phone]     (approximate)   History   Abdominal Pain   HPI  Cheyenne Wise is a 21 y.o. female who presents today for evaluation of suprapubic abdominal pain and burning with urination that began this morning.  Patient denies flank pain or unilateral discomfort.  She denies nausea or vomiting or diarrhea.  She has not had a fever or chills.  She reports that she is sexually active and has not had any vaginal discharge or bleeding.  However, she wants to be "checked for everything."  There are no problems to display for this patient.         Physical Exam   Triage Vital Signs: ED Triage Vitals  Enc Vitals Group     BP 03/15/22 0851 130/63     Pulse Rate 03/15/22 0851 77     Resp 03/15/22 0851 18     Temp 03/15/22 0851 98.5 F (36.9 C)     Temp Source 03/15/22 0851 Oral     SpO2 03/15/22 0851 95 %     Weight 03/15/22 0848 128 lb 15.5 oz (58.5 kg)     Height 03/15/22 0848 5\' 5"  (1.651 m)     Head Circumference --      Peak Flow --      Pain Score 03/15/22 0847 6     Pain Loc --      Pain Edu? --      Excl. in [place]? --     Most recent vital signs: Vitals:   03/15/22 1152 03/15/22 1316  BP: 126/66 121/60  Pulse: 64 67  Resp: 18 15  Temp: 98.5 F (36.9 C) 98.4 F (36.9 C)  SpO2: 99% 98%    Physical Exam Vitals and nursing note reviewed.  Constitutional:      General: Awake and alert. No acute distress.    Appearance: Normal appearance. The patient is normal weight.  HENT:     Head: Normocephalic and atraumatic.     Mouth: Mucous membranes are moist.  Eyes:     General: PERRL. Normal EOMs        Right eye: No discharge.        Left eye: No discharge.     Conjunctiva/sclera: Conjunctivae normal.  Cardiovascular:     Rate and Rhythm: Normal rate and regular rhythm.     Pulses: Normal pulses.     Heart sounds: Normal heart  sounds Pulmonary:     Effort: Pulmonary effort is normal. No respiratory distress.     Breath sounds: Normal breath sounds.  Abdominal:     Abdomen is soft. There is suprapubic abdominal tenderness. No rebound or guarding. No distention. No CVAT.  Declined pelvic exam Musculoskeletal:        General: No swelling. Normal range of motion.     Cervical back: Normal range of motion and neck supple.  Skin:    General: Skin is warm and dry.     Capillary Refill: Capillary refill takes less than 2 seconds.     Findings: No rash.  Neurological:     Mental Status: The patient is awake and alert.      ED Results / Procedures / Treatments   Labs (all labs ordered are listed, but only abnormal results are displayed) Labs Reviewed  COMPREHENSIVE METABOLIC PANEL - Abnormal; Notable for the following components:  Result Value   Glucose, Bld 109 (*)    AST 12 (*)    All other components within normal limits  CBC - Abnormal; Notable for the following components:   WBC 11.6 (*)    All other components within normal limits  URINALYSIS, ROUTINE W REFLEX MICROSCOPIC - Abnormal; Notable for the following components:   Color, Urine ORANGE (*)    APPearance HAZY (*)    Glucose, UA   (*)    Value: TEST NOT REPORTED DUE TO COLOR INTERFERENCE OF URINE PIGMENT   Hgb urine dipstick   (*)    Value: TEST NOT REPORTED DUE TO COLOR INTERFERENCE OF URINE PIGMENT   Bilirubin Urine   (*)    Value: TEST NOT REPORTED DUE TO COLOR INTERFERENCE OF URINE PIGMENT   Ketones, ur   (*)    Value: TEST NOT REPORTED DUE TO COLOR INTERFERENCE OF URINE PIGMENT   Protein, ur   (*)    Value: TEST NOT REPORTED DUE TO COLOR INTERFERENCE OF URINE PIGMENT   Nitrite   (*)    Value: TEST NOT REPORTED DUE TO COLOR INTERFERENCE OF URINE PIGMENT   Leukocytes,Ua   (*)    Value: TEST NOT REPORTED DUE TO COLOR INTERFERENCE OF URINE PIGMENT   WBC, UA >50 (*)    Bacteria, UA FEW (*)    Crystals PRESENT (*)    All other  components within normal limits  CHLAMYDIA/NGC RT PCR ([facility] ONLY)            WET PREP, GENITAL  LIPASE, BLOOD  RAPID HIV SCREEN (HIV 1/2 AB+AG)  HIV ANTIBODY (ROUTINE TESTING W REFLEX)  POC URINE PREG, ED     EKG     RADIOLOGY I independently reviewed and interpreted imaging and agree with radiologists findings.     PROCEDURES:  Critical Care performed:   Procedures   MEDICATIONS ORDERED IN ED: Medications  iohexol (OMNIPAQUE) 300 MG/ML solution 100 mL (100 mLs Intravenous Contrast Given 03/15/22 1204)     IMPRESSION / MDM / ASSESSMENT AND PLAN / ED COURSE  I reviewed the triage vital signs and the nursing notes.   Differential diagnosis includes, but is not limited to, UTI/pyelonephritis/nephrolithiasis, infected stone, appendicitis.  Patient is awake and alert, hemodynamically stable and afebrile.  Urinalysis reveals greater than 50 WBCs with WBC clumps, and 21-50 RBCs.  She did take Azo prior to arrival.  She is a leukocytosis of 11.  Given that this problem appears to be recurrent for her, we discussed the options of further work-up including CT scan for evaluation of possible concurrent stone or other intra-abdominal abnormality.  Patient agreed to this and CT scan was reassuring.  Patient also requested to be tested for sexually transmitted diseases.  She declined pelvic exam so patient self swabbed, and gonorrhea, chlamydia, and wet prep was negative.  HIV was also negative.  Still awaiting RPR, patient is aware.  Patient was started on antibiotics for urinary tract infection.  No CVA tenderness or fever to suggest pyelonephritis.  We discussed tricked return precautions and the importance of close outpatient follow-up.  Patient understands and agrees with plan.  She was discharged in stable condition.   Patient's presentation is most consistent with acute complicated illness / injury requiring diagnostic workup.      FINAL CLINICAL IMPRESSION(S) / ED  DIAGNOSES   Final diagnoses:  Acute cystitis with hematuria     Rx / DC Orders   ED Discharge Orders  Ordered    cefdinir (OMNICEF) 300 MG capsule  2 times daily        03/15/22 1258             Note:  This document was prepared using Dragon voice recognition software and may include unintentional dictation errors.   Marquette Old, PA-C 03/15/22 1357    Duffy Bruce, MD 03/15/22 1537

## 2022-03-15 NOTE — Discharge Instructions (Addendum)
Your labs and CAT scan were normal.  There is no kidney stone noted.  Your urine does appear to have an infection, though your STD testing was negative.  Please take the antibiotic as prescribed.  Please return for any new, worsening, or change in symptoms or other concerns.

## 2022-06-08 ENCOUNTER — Emergency Department
Admission: EM | Admit: 2022-06-08 | Discharge: 2022-06-08 | Disposition: A | Discharge status: Home / Self Care | Payer: Medicaid Other | Attending: Emergency Medicine | Admitting: Emergency Medicine

## 2022-06-08 ENCOUNTER — Other Ambulatory Visit: Payer: Self-pay

## 2022-06-08 DIAGNOSIS — Z1152 Encounter for screening for COVID-19: Secondary | ICD-10-CM | POA: Insufficient documentation

## 2022-06-08 DIAGNOSIS — R0981 Nasal congestion: Secondary | ICD-10-CM | POA: Insufficient documentation

## 2022-06-08 DIAGNOSIS — M791 Myalgia, unspecified site: Secondary | ICD-10-CM | POA: Insufficient documentation

## 2022-06-08 DIAGNOSIS — R059 Cough, unspecified: Secondary | ICD-10-CM | POA: Diagnosis present

## 2022-06-08 DIAGNOSIS — Z8616 Personal history of COVID-19: Secondary | ICD-10-CM | POA: Diagnosis not present

## 2022-06-08 DIAGNOSIS — J111 Influenza due to unidentified influenza virus with other respiratory manifestations: Secondary | ICD-10-CM

## 2022-06-08 LAB — RESP PANEL BY RT-PCR (RSV, FLU A&B, COVID)  RVPGX2
Influenza A by PCR: NEGATIVE
Influenza B by PCR: NEGATIVE
Resp Syncytial Virus by PCR: NEGATIVE
SARS Coronavirus 2 by RT PCR: NEGATIVE

## 2022-06-08 MED ORDER — ONDANSETRON 4 MG PO TBDP: 4.0000 mg | ORAL_TABLET | Freq: Three times a day (TID) | ORAL | 0 refills | Status: DC | PRN

## 2022-06-08 MED ORDER — ONDANSETRON 8 MG PO TBDP
8.0000 mg | ORAL_TABLET | Freq: Once | ORAL | Status: AC
  Administered 2022-06-08: 8 mg via ORAL
  Filled 2022-06-08: qty 1

## 2022-06-08 NOTE — ED Triage Notes (Signed)
Pt presents to ED via POV with c/o of having flu like s/s since yesterday. NAD noted.

## 2022-06-08 NOTE — Discharge Instructions (Signed)
Your Covid and Flu test were negative today.  Your symptoms appear to be due to a viral illness from a different type of virus.  Control your symptoms with nausea medicine (Zofran) and ibuprofen and focus on resting and staying hydrated until you feel better.

## 2022-06-08 NOTE — ED Provider Notes (Signed)
   [facility] Provider Note    Event Date/Time   First MD Initiated Contact with Patient 06/08/22 [phone]     (approximate)   History   Cough and Nasal Congestion   HPI  Cheyenne Wise is a 21 y.o. female with no significant past medical history who comes ED complaining of body aches, chills, fatigue, nausea since yesterday.  Denies any pain.  No shortness of breath or cough.  No headache or neck stiffness.  No falls or trauma or syncope.     Physical Exam   Triage Vital Signs: ED Triage Vitals  Enc Vitals Group     BP 06/08/22 0732 124/62     Pulse Rate 06/08/22 0732 76     Resp 06/08/22 0732 20     Temp 06/08/22 0732 98.4 F (36.9 C)     Temp Source 06/08/22 0732 Oral     SpO2 06/08/22 0732 99 %     Weight 06/08/22 0748 128 lb 15.5 oz (58.5 kg)     Height 06/08/22 0748 5\' 5"  (1.651 m)     Head Circumference --      Peak Flow --      Pain Score 06/08/22 0728 0     Pain Loc --      Pain Edu? --      Excl. in GC? --     Most recent vital signs: Vitals:   06/08/22 0732  BP: 124/62  Pulse: 76  Resp: 20  Temp: 98.4 F (36.9 C)  SpO2: 99%    General: Awake, no distress.  CV:  Good peripheral perfusion.  Regular rate rhythm Resp:  Normal effort.  Clear to auscultation bilaterally Abd:  No distention.  Soft nontender Other:  No lower extremity edema   ED Results / Procedures / Treatments   Labs (all labs ordered are listed, but only abnormal results are displayed) Labs Reviewed  RESP PANEL BY RT-PCR (RSV, FLU A&B, COVID)  [id number]     RADIOLOGY    PROCEDURES:  Procedures   MEDICATIONS ORDERED IN ED: Medications  ondansetron (ZOFRAN-ODT) disintegrating tablet 8 mg (8 mg Oral Given 06/08/22 0816)     IMPRESSION / MDM / ASSESSMENT AND PLAN / ED COURSE  I reviewed the triage vital signs and the nursing notes.                              Differential diagnosis includes, but is not limited to, viral illness, GERD.   Doubt pancreatitis cholecystitis pregnancy pneumonia or pericardial effusion.   Patient presents with constitutional symptoms consistent with a viral illness.  Nontoxic.  Tolerating oral intake.  COVID and flu negative.  Vital signs normal, exam reassuring.  Stable for outpatient follow-up.       FINAL CLINICAL IMPRESSION(S) / ED DIAGNOSES   Final diagnoses:  Influenza-like illness     Rx / DC Orders   ED Discharge Orders          Ordered    ondansetron (ZOFRAN-ODT) 4 MG disintegrating tablet  Every 8 hours PRN        06/08/22 0843             Note:  This document was prepared using Dragon voice recognition software and may include unintentional dictation errors.   06/10/22, MD 06/08/22 [phone]

## 2022-07-31 ENCOUNTER — Other Ambulatory Visit: Payer: Self-pay

## 2022-07-31 ENCOUNTER — Emergency Department
Admission: EM | Admit: 2022-07-31 | Discharge: 2022-07-31 | Disposition: A | Discharge status: Home / Self Care | Payer: Medicaid Other | Attending: Emergency Medicine | Admitting: Emergency Medicine

## 2022-07-31 DIAGNOSIS — Z202 Contact with and (suspected) exposure to infections with a predominantly sexual mode of transmission: Secondary | ICD-10-CM | POA: Diagnosis not present

## 2022-07-31 LAB — URINALYSIS, ROUTINE W REFLEX MICROSCOPIC
Bilirubin Urine: NEGATIVE
Glucose, UA: NEGATIVE mg/dL
Hgb urine dipstick: NEGATIVE
Ketones, ur: NEGATIVE mg/dL
Leukocytes,Ua: NEGATIVE
Nitrite: NEGATIVE
Protein, ur: NEGATIVE mg/dL
Specific Gravity, Urine: 1.012 (ref 1.005–1.030)
pH: 7 (ref 5.0–8.0)

## 2022-07-31 LAB — WET PREP, GENITAL
Clue Cells Wet Prep HPF POC: NONE SEEN
Sperm: NONE SEEN
Trich, Wet Prep: NONE SEEN
WBC, Wet Prep HPF POC: <10 (ref <10)
Yeast Wet Prep HPF POC: NONE SEEN

## 2022-07-31 LAB — CHLAMYDIA/NGC RT PCR (ARMC ONLY)
Chlamydia Tr: NOT DETECTED
N gonorrhoeae: NOT DETECTED

## 2022-07-31 MED ORDER — METRONIDAZOLE 500 MG PO TABS: 500.0000 mg | ORAL_TABLET | Freq: Two times a day (BID) | ORAL | 0 refills | Status: DC

## 2022-07-31 NOTE — ED Notes (Signed)
62 yof with a c/c of possible exposure to an STD. The pt was ambulatory to the room. The pt was warm, pink, and dry. Alert and oriented x 4.

## 2022-07-31 NOTE — Discharge Instructions (Addendum)
Follow-up with the [facility] for over the weekend return to the emergency department if you see that your gonorrhea and Chlamydia test are positive on MyChart.  A prescription for Flagyl was sent to the pharmacy.  Be aware that she cannot drink alcohol with this medication as it could cause a severe reaction.

## 2022-07-31 NOTE — ED Triage Notes (Signed)
Pt to ED via POV from home. Pt reports partner told her they tested positive for trich. Pt reports no symptoms at this time.

## 2022-07-31 NOTE — ED Provider Notes (Signed)
[facility] Provider Note    Event Date/Time   First MD Initiated Contact with Patient 07/31/22 1028     (approximate)   History   Exposure to STD   HPI  Cheyenne Wise is a 22 y.o. female presents to the ED with report of exposure to trichomonas today.  Patient states that she has had some itching the last couple of days but denies any vaginal discharge or vaginal pain.     Physical Exam   Triage Vital Signs: ED Triage Vitals  Enc Vitals Group     BP 07/31/22 1019 137/89     Pulse Rate 07/31/22 1019 62     Resp 07/31/22 1019 16     Temp 07/31/22 1019 98.2 F (36.8 C)     Temp Source 07/31/22 1019 Oral     SpO2 07/31/22 1019 96 %     Weight 07/31/22 1031 128 lb 15.5 oz (58.5 kg)     Height 07/31/22 1031 5' 5"$  (1.651 m)     Head Circumference --      Peak Flow --      Pain Score 07/31/22 1021 0     Pain Loc --      Pain Edu? --      Excl. in [place]? --     Most recent vital signs: Vitals:   07/31/22 1019  BP: 137/89  Pulse: 62  Resp: 16  Temp: 98.2 F (36.8 C)  SpO2: 96%     General: Awake, no distress.  CV:  Good peripheral perfusion.  Resp:  Normal effort.  Abd:  No distention.  Other:     ED Results / Procedures / Treatments   Labs (all labs ordered are listed, but only abnormal results are displayed) Labs Reviewed  URINALYSIS, ROUTINE W REFLEX MICROSCOPIC - Abnormal; Notable for the following components:      Result Value   Color, Urine STRAW (*)    APPearance CLEAR (*)    All other components within normal limits  WET PREP, GENITAL  CHLAMYDIA/NGC RT PCR ([facility] ONLY)               PROCEDURES:  Critical Care performed:   Procedures   MEDICATIONS ORDERED IN ED: Medications - No data to display   IMPRESSION / MDM / [place] / ED COURSE  I reviewed the triage vital signs and the nursing notes.   Differential diagnosis includes, but is not limited to, urinary tract infection, STD, vaginitis,  exposure to STD.  22 year old female presents to the ED stating that she was told this morning that she had been exposed to trichomonas.  Patient has no vaginal discharge and states she only has had an occasional vaginal itch.  GC and Chlamydia were nondetected, wet prep negative, urinalysis negative.  At the time patient was discharged the chlamydia and GC test had not resulted and she is aware that she needs to check MyChart for this.  A prescription for Flagyl was sent to her pharmacy to treat empirically since she was just recently exposed and may not have been seen on her wet prep.  Patient was given information about making an appointment with the [facility] if she develops any symptoms or still concerned about STDs.      Patient's presentation is most consistent with acute complicated illness / injury requiring diagnostic workup.  FINAL CLINICAL IMPRESSION(S) / ED DIAGNOSES   Final diagnoses:  Exposure to trichomonas  Rx / DC Orders   ED Discharge Orders          Ordered    metroNIDAZOLE (FLAGYL) 500 MG tablet  2 times daily        07/31/22 1205             Note:  This document was prepared using Dragon voice recognition software and may include unintentional dictation errors.   Johnn Hai, PA-C 07/31/22 1329    Carrie Mew, MD 07/31/22 [phone]

## 2022-10-12 ENCOUNTER — Emergency Department
Admission: EM | Admit: 2022-10-12 | Discharge: 2022-10-12 | Disposition: A | Discharge status: Home / Self Care | Payer: Medicaid Other | Attending: Emergency Medicine | Admitting: Emergency Medicine

## 2022-10-12 ENCOUNTER — Other Ambulatory Visit: Payer: Self-pay

## 2022-10-12 ENCOUNTER — Encounter: Payer: Self-pay | Admitting: Emergency Medicine

## 2022-10-12 DIAGNOSIS — N898 Other specified noninflammatory disorders of vagina: Secondary | ICD-10-CM | POA: Diagnosis present

## 2022-10-12 DIAGNOSIS — Z202 Contact with and (suspected) exposure to infections with a predominantly sexual mode of transmission: Secondary | ICD-10-CM | POA: Diagnosis not present

## 2022-10-12 LAB — WET PREP, GENITAL
Clue Cells Wet Prep HPF POC: NONE SEEN
Sperm: NONE SEEN
Trich, Wet Prep: NONE SEEN
WBC, Wet Prep HPF POC: <10 (ref <10)
Yeast Wet Prep HPF POC: NONE SEEN

## 2022-10-12 LAB — CHLAMYDIA/NGC RT PCR (ARMC ONLY)
Chlamydia Tr: NOT DETECTED
N gonorrhoeae: NOT DETECTED

## 2022-10-12 MED ORDER — DOXYCYCLINE HYCLATE 100 MG PO TABS: 100.0000 mg | ORAL_TABLET | Freq: Two times a day (BID) | ORAL | 0 refills | Status: DC

## 2022-10-12 MED ORDER — DOXYCYCLINE HYCLATE 100 MG PO TABS
100.0000 mg | ORAL_TABLET | Freq: Once | ORAL | Status: DC
  Filled 2022-10-12: qty 1

## 2022-10-12 MED ORDER — CEFTRIAXONE SODIUM 1 G IJ SOLR
500.0000 mg | Freq: Once | INTRAMUSCULAR | Status: DC
  Filled 2022-10-12: qty 10

## 2022-10-12 NOTE — ED Provider Notes (Signed)
   [facility] Provider Note    Event Date/Time   First MD Initiated Contact with Patient 10/12/22 [phone]     (approximate)   History   Vaginal discharge   HPI  Cheyenne Wise is a 22 y.o. female who presents with complaints of 1 week of vaginal discharge which she reports is clear.  She does report unprotected sex.       Physical Exam   Triage Vital Signs: ED Triage Vitals [10/12/22 0828]  Enc Vitals Group     BP 128/69     Pulse Rate 71     Resp 18     Temp 98.3 F (36.8 C)     Temp Source Oral     SpO2 100 %     Weight      Height      Head Circumference      Peak Flow      Pain Score 5     Pain Loc      Pain Edu?      Excl. in [place]?     Most recent vital signs: Vitals:   10/12/22 0828  BP: 128/69  Pulse: 71  Resp: 18  Temp: 98.3 F (36.8 C)  SpO2: 100%     General: Awake, no distress.  CV:  Good peripheral perfusion.  Resp:  Normal effort.  Abd:  No distention.  Other:  GU: Clearish discharge, no cervicitis, no CMT   ED Results / Procedures / Treatments   Labs (all labs ordered are listed, but only abnormal results are displayed) Labs Reviewed  WET PREP, GENITAL  CHLAMYDIA/NGC RT PCR ([facility] ONLY)               EKG     RADIOLOGY     PROCEDURES:  Critical Care performed:   Procedures   MEDICATIONS ORDERED IN ED: Medications - No data to display   IMPRESSION / MDM / ASSESSMENT AND PLAN / ED COURSE  I reviewed the triage vital signs and the nursing notes. Patient's presentation is most consistent with acute complicated illness / injury requiring diagnostic workup.  Patient with vaginal discharge as above, differential includes STI, BV  GU exam with some clearish vaginal discharge, no fishy odor.  Wet prep negative, discussed with patient, she would like to avoid treatment at this time, pending GC chlamydia.  Referred to health department for further STI testing        FINAL CLINICAL  IMPRESSION(S) / ED DIAGNOSES   Final diagnoses:  Vaginal discharge     Rx / DC Orders   ED Discharge Orders          Ordered    doxycycline (VIBRA-TABS) 100 MG tablet  2 times daily        10/12/22 0900             Note:  This document was prepared using Dragon voice recognition software and may include unintentional dictation errors.   Jene Every, MD 10/12/22 [phone]

## 2022-10-12 NOTE — ED Notes (Signed)
Pt undressing now from waist down for pelvic exam. Given blanket

## 2022-10-12 NOTE — ED Notes (Signed)
Pt stated she wanted to wait for her results to come back prior to taking medicines

## 2022-10-12 NOTE — ED Triage Notes (Signed)
Patient to ED for STD testing. Patient states she has had an abnormal discharge x1 week.

## 2022-12-08 ENCOUNTER — Other Ambulatory Visit: Payer: Self-pay

## 2022-12-08 ENCOUNTER — Emergency Department
Admission: EM | Admit: 2022-12-08 | Discharge: 2022-12-08 | Disposition: A | Discharge status: Home / Self Care | Payer: Medicaid Other | Attending: Emergency Medicine | Admitting: Emergency Medicine

## 2022-12-08 ENCOUNTER — Encounter: Payer: Self-pay | Admitting: Intensive Care

## 2022-12-08 DIAGNOSIS — N3090 Cystitis, unspecified without hematuria: Secondary | ICD-10-CM | POA: Diagnosis not present

## 2022-12-08 DIAGNOSIS — R350 Frequency of micturition: Secondary | ICD-10-CM | POA: Diagnosis present

## 2022-12-08 LAB — URINALYSIS, ROUTINE W REFLEX MICROSCOPIC
Bilirubin Urine: NEGATIVE
Glucose, UA: NEGATIVE mg/dL
Ketones, ur: 20 mg/dL — AB
Nitrite: POSITIVE — AB
Protein, ur: 30 mg/dL — AB
Specific Gravity, Urine: 1.012 (ref 1.005–1.030)
WBC, UA: >50 WBC/hpf (ref 0–5)
pH: 7 (ref 5.0–8.0)

## 2022-12-08 LAB — CHLAMYDIA/NGC RT PCR (ARMC ONLY)
Chlamydia Tr: NOT DETECTED
N gonorrhoeae: NOT DETECTED

## 2022-12-08 LAB — WET PREP, GENITAL
Clue Cells Wet Prep HPF POC: NONE SEEN
Sperm: NONE SEEN
Trich, Wet Prep: NONE SEEN
WBC, Wet Prep HPF POC: <10 (ref <10)
Yeast Wet Prep HPF POC: NONE SEEN

## 2022-12-08 LAB — POC URINE PREG, ED: Preg Test, Ur: NEGATIVE

## 2022-12-08 MED ORDER — CEFDINIR 300 MG PO CAPS: 300.0000 mg | ORAL_CAPSULE | Freq: Two times a day (BID) | ORAL | 0 refills | Status: AC

## 2022-12-08 NOTE — ED Notes (Signed)
See triage note  Presents with urinary freq since yesterday  Denies any fever or other sx's

## 2022-12-08 NOTE — Discharge Instructions (Signed)
Your urine is positive for infection.  Please take antibiotics as prescribed.  You did not wish to wait for the gonorrhea/chlamydia test, I will call you if this is positive.  Please return for any new, worsening, or change in symptoms or other concerns.

## 2022-12-08 NOTE — ED Provider Notes (Signed)
[facility] Provider Note    Event Date/Time   First MD Initiated Contact with Patient 12/08/22 [phone]     (approximate)   History   Urinary Frequency   HPI  Cheyenne Wise is a 22 y.o. female who presents today with 2 days of burning with urination, urinary frequency, and urinary urgency.  Patient denies any vaginal discharge or bleeding.  She reports that she is sexually active with 1 female partner.  No abdominal pain.  She has been taking Azo.  No flank pain or fever.   There are no problems to display for this patient.         Physical Exam   Triage Vital Signs: ED Triage Vitals  Enc Vitals Group     BP 12/08/22 0721 105/74     Pulse Rate 12/08/22 0721 76     Resp 12/08/22 0721 16     Temp 12/08/22 0721 98.5 F (36.9 C)     Temp Source 12/08/22 0721 Oral     SpO2 12/08/22 0721 94 %     Weight 12/08/22 0722 145 lb (65.8 kg)     Height 12/08/22 0722 5\' 5"  (1.651 m)     Head Circumference --      Peak Flow --      Pain Score 12/08/22 0721 5     Pain Loc --      Pain Edu? --      Excl. in [place]? --     Most recent vital signs: Vitals:   12/08/22 0721  BP: 105/74  Pulse: 76  Resp: 16  Temp: 98.5 F (36.9 C)  SpO2: 94%    Physical Exam Vitals and nursing note reviewed.  Constitutional:      General: Awake and alert. No acute distress.    Appearance: Normal appearance. The patient is normal weight.  HENT:     Head: Normocephalic and atraumatic.     Mouth: Mucous membranes are moist.  Eyes:     General: PERRL. Normal EOMs        Right eye: No discharge.        Left eye: No discharge.     Conjunctiva/sclera: Conjunctivae normal.  Cardiovascular:     Rate and Rhythm: Normal rate and regular rhythm.     Pulses: Normal pulses.  Pulmonary:     Effort: Pulmonary effort is normal. No respiratory distress.     Breath sounds: Normal breath sounds.  Abdominal:     Abdomen is soft. There is no abdominal tenderness. No rebound or  guarding. No distention.  No CVA tenderness Musculoskeletal:        General: No swelling. Normal range of motion.     Cervical back: Normal range of motion and neck supple.  Skin:    General: Skin is warm and dry.     Capillary Refill: Capillary refill takes less than 2 seconds.     Findings: No rash.  Neurological:     Mental Status: The patient is awake and alert.      ED Results / Procedures / Treatments   Labs (all labs ordered are listed, but only abnormal results are displayed) Labs Reviewed  URINALYSIS, ROUTINE W REFLEX MICROSCOPIC - Abnormal; Notable for the following components:      Result Value   Color, Urine AMBER (*)    APPearance HAZY (*)    Hgb urine dipstick SMALL (*)    Ketones, ur 20 (*)    Protein, ur  30 (*)    Nitrite POSITIVE (*)    Leukocytes,Ua LARGE (*)    Bacteria, UA MANY (*)    All other components within normal limits  POC URINE PREG, ED - Normal  CHLAMYDIA/NGC RT PCR ([facility] ONLY)            WET PREP, GENITAL  URINE CULTURE     EKG     RADIOLOGY     PROCEDURES:  Critical Care performed:   Procedures   MEDICATIONS ORDERED IN ED: Medications - No data to display   IMPRESSION / MDM / ASSESSMENT AND PLAN / ED COURSE  I reviewed the triage vital signs and the nursing notes.   Differential diagnosis includes, but is not limited to, urinary tract infection, sexually transmitted disease, urethritis, less likely pyelonephritis.  Patient is awake and alert, hemodynamically stable and afebrile.  She has minimal tenderness to her suprapubic area.  No fever or CVA tenderness to suggest pyelonephritis or nephrolithiasis.  Urinalysis reveals leukocytes and nitrites, consistent with urinary tract infection.  She was started on antibiotics.  She did not wish to wait for the results of her gonorrhea/chlamydia test.  She declined other STD testing.  Urine pregnancy is negative.  Wet prep is negative.  We discussed the importance of close  outpatient follow-up and strict return precautions.  Patient understands and agrees with plan.  She was discharged in stable condition.   Patient's presentation is most consistent with acute complicated illness / injury requiring diagnostic workup.      FINAL CLINICAL IMPRESSION(S) / ED DIAGNOSES   Final diagnoses:  Cystitis     Rx / DC Orders   ED Discharge Orders          Ordered    cefdinir (OMNICEF) 300 MG capsule  2 times daily        12/08/22 5409             Note:  This document was prepared using Dragon voice recognition software and may include unintentional dictation errors.   [name] 12/08/22 1148    Chesley Noon, MD 12/08/22 [phone]

## 2022-12-08 NOTE — ED Triage Notes (Signed)
Patient c/o burning during urination and frequent urination. Has been taking AZO with no relief

## 2022-12-10 LAB — URINE CULTURE: Culture: 50000 — AB

## 2022-12-31 ENCOUNTER — Encounter: Payer: Self-pay | Admitting: Emergency Medicine

## 2022-12-31 ENCOUNTER — Emergency Department
Admission: EM | Admit: 2022-12-31 | Discharge: 2022-12-31 | Disposition: A | Discharge status: Home / Self Care | Payer: Medicaid Other | Attending: Student in an Organized Health Care Education/Training Program | Admitting: Student in an Organized Health Care Education/Training Program

## 2022-12-31 ENCOUNTER — Other Ambulatory Visit: Payer: Self-pay

## 2022-12-31 DIAGNOSIS — O219 Vomiting of pregnancy, unspecified: Secondary | ICD-10-CM | POA: Diagnosis present

## 2022-12-31 DIAGNOSIS — R112 Nausea with vomiting, unspecified: Secondary | ICD-10-CM

## 2022-12-31 DIAGNOSIS — Z3A00 Weeks of gestation of pregnancy not specified: Secondary | ICD-10-CM | POA: Insufficient documentation

## 2022-12-31 DIAGNOSIS — Z3490 Encounter for supervision of normal pregnancy, unspecified, unspecified trimester: Secondary | ICD-10-CM

## 2022-12-31 LAB — COMPREHENSIVE METABOLIC PANEL
ALT: 16 U/L (ref 0–44)
AST: 14 U/L — ABNORMAL LOW (ref 15–41)
Albumin: 4.1 g/dL (ref 3.5–5.0)
Alkaline Phosphatase: 51 U/L (ref 38–126)
Anion gap: 6 (ref 5–15)
BUN: 7 mg/dL (ref 6–20)
CO2: 23 mmol/L (ref 22–32)
Calcium: 9.3 mg/dL (ref 8.9–10.3)
Chloride: 107 mmol/L (ref 98–111)
Creatinine, Ser: 0.68 mg/dL (ref 0.44–1.00)
GFR, Estimated: >60 mL/min (ref >60)
Glucose, Bld: 105 mg/dL — ABNORMAL HIGH (ref 70–99)
Potassium: 3.5 mmol/L (ref 3.5–5.1)
Sodium: 136 mmol/L (ref 135–145)
Total Bilirubin: 0.4 mg/dL (ref 0.3–1.2)
Total Protein: 7 g/dL (ref 6.5–8.1)

## 2022-12-31 LAB — URINALYSIS, ROUTINE W REFLEX MICROSCOPIC
Bilirubin Urine: NEGATIVE
Glucose, UA: NEGATIVE mg/dL
Hgb urine dipstick: NEGATIVE
Ketones, ur: NEGATIVE mg/dL
Leukocytes,Ua: NEGATIVE
Nitrite: NEGATIVE
Protein, ur: NEGATIVE mg/dL
Specific Gravity, Urine: 1.014 (ref 1.005–1.030)
pH: 6 (ref 5.0–8.0)

## 2022-12-31 LAB — CBC
HCT: 36.1 % (ref 36.0–46.0)
Hemoglobin: 12.4 g/dL (ref 12.0–15.0)
MCH: 31.4 pg (ref 26.0–34.0)
MCHC: 34.3 g/dL (ref 30.0–36.0)
MCV: 91.4 fL (ref 80.0–100.0)
Platelets: 240 10*3/uL (ref 150–400)
RBC: 3.95 MIL/uL (ref 3.87–5.11)
RDW: 11.9 % (ref 11.5–15.5)
WBC: 7.2 10*3/uL (ref 4.0–10.5)
nRBC: 0 % (ref 0.0–0.2)

## 2022-12-31 LAB — POC URINE PREG, ED
Preg Test, Ur: NEGATIVE
Preg Test, Ur: POSITIVE — AB

## 2022-12-31 LAB — LIPASE, BLOOD: Lipase: 28 U/L (ref 11–51)

## 2022-12-31 MED ORDER — ONDANSETRON 4 MG PO TBDP
4.0000 mg | ORAL_TABLET | Freq: Once | ORAL | Status: AC
  Administered 2022-12-31: 4 mg via ORAL
  Filled 2022-12-31: qty 1

## 2022-12-31 MED ORDER — ONDANSETRON 4 MG PO TBDP: 4.0000 mg | ORAL_TABLET | Freq: Three times a day (TID) | ORAL | 0 refills | Status: AC | PRN

## 2022-12-31 NOTE — ED Provider Notes (Signed)
[facility] Provider Note    Event Date/Time   First MD Initiated Contact with Patient 12/31/22 [phone]     (approximate)   History   Emesis   HPI  Cheyenne Wise is a 22 y.o. female who presents to the ER for evaluation of nausea for the past 2 days.  Denies any abdominal pain.  States is worse in the morning.  Denies any diarrhea no fevers.  Worried that she might be pregnant.     Physical Exam   Triage Vital Signs: ED Triage Vitals  Encounter Vitals Group     BP 12/31/22 0812 119/67     Systolic BP Percentile --      Diastolic BP Percentile --      Pulse Rate 12/31/22 0812 84     Resp 12/31/22 0812 19     Temp 12/31/22 0812 98.9 F (37.2 C)     Temp Source 12/31/22 0812 Oral     SpO2 12/31/22 0812 100 %     Weight --      Height --      Head Circumference --      Peak Flow --      Pain Score 12/31/22 0818 0     Pain Loc --      Pain Education --      Exclude from Growth Chart --     Most recent vital signs: Vitals:   12/31/22 0812  BP: 119/67  Pulse: 84  Resp: 19  Temp: 98.9 F (37.2 C)  SpO2: 100%     Constitutional: Alert  Eyes: Conjunctivae are normal.  Head: Atraumatic. Nose: No congestion/rhinnorhea. Mouth/Throat: Mucous membranes are moist.   Neck: Painless ROM.  Cardiovascular:   Good peripheral circulation. Respiratory: Normal respiratory effort.  No retractions.  Gastrointestinal: Soft and nontender.  Musculoskeletal:  no deformity Neurologic:  MAE spontaneously. No gross focal neurologic deficits are appreciated.  Skin:  Skin is warm, dry and intact. No rash noted. Psychiatric: Mood and affect are normal. Speech and behavior are normal.    ED Results / Procedures / Treatments   Labs (all labs ordered are listed, but only abnormal results are displayed) Labs Reviewed  COMPREHENSIVE METABOLIC PANEL - Abnormal; Notable for the following components:      Result Value   Glucose, Bld 105 (*)    AST 14 (*)     All other components within normal limits  URINALYSIS, ROUTINE W REFLEX MICROSCOPIC - Abnormal; Notable for the following components:   Color, Urine YELLOW (*)    APPearance HAZY (*)    All other components within normal limits  POC URINE PREG, ED - Abnormal; Notable for the following components:   Preg Test, Ur Positive (*)    All other components within normal limits  LIPASE, BLOOD  CBC  POC URINE PREG, ED     EKG     RADIOLOGY    PROCEDURES:  Critical Care performed:   Procedures   MEDICATIONS ORDERED IN ED: Medications  ondansetron (ZOFRAN-ODT) disintegrating tablet 4 mg (has no administration in time range)     IMPRESSION / MDM / ASSESSMENT AND PLAN / ED COURSE  I reviewed the triage vital signs and the nursing notes.                              Differential diagnosis includes, but is not limited to, enteritis, gastritis, dehydration, pregnancy  Patient  presenting to the ER for evaluation of symptoms as described above.  Based on symptoms, risk factors and considered above differential, this presenting complaint could reflect a potentially life-threatening illness therefore the patient will be placed on continuous pulse oximetry and telemetry for monitoring.  Laboratory evaluation will be sent to evaluate for the above complaints.  Patient with benign and reassuring exam afebrile hemodynamically stable.  She does have positive urine pregnancy.  This not consistent with ectopic.  Patient requesting resources for consideration of elective AB.  Will provide outpatient resources.  Will give prescription for Zofran.  We discussed strict return precautions.  Patient agreeable plan.       FINAL CLINICAL IMPRESSION(S) / ED DIAGNOSES   Final diagnoses:  Nausea and vomiting, unspecified vomiting type  Pregnancy, unspecified gestational age     Rx / DC Orders   ED Discharge Orders          Ordered    ondansetron (ZOFRAN-ODT) 4 MG disintegrating tablet   Every 8 hours PRN        12/31/22 0947             Note:  This document was prepared using Dragon voice recognition software and may include unintentional dictation errors.    Willy Eddy, MD 12/31/22 [phone]

## 2022-12-31 NOTE — ED Triage Notes (Signed)
Patient to ED via POV for nausea and vomiting x2 days. Denies abd pain.

## 2022-12-31 NOTE — ED Notes (Signed)
Urine POC is Positive

## 2023-06-13 ENCOUNTER — Other Ambulatory Visit: Payer: Self-pay

## 2023-06-13 ENCOUNTER — Emergency Department
Admission: EM | Admit: 2023-06-13 | Discharge: 2023-06-13 | Disposition: A | Discharge status: Home / Self Care | Payer: Medicaid Other | Attending: Emergency Medicine | Admitting: Emergency Medicine

## 2023-06-13 ENCOUNTER — Encounter: Payer: Self-pay | Admitting: Emergency Medicine

## 2023-06-13 DIAGNOSIS — R1110 Vomiting, unspecified: Secondary | ICD-10-CM | POA: Diagnosis not present

## 2023-06-13 DIAGNOSIS — N898 Other specified noninflammatory disorders of vagina: Secondary | ICD-10-CM | POA: Diagnosis present

## 2023-06-13 DIAGNOSIS — L731 Pseudofolliculitis barbae: Secondary | ICD-10-CM | POA: Diagnosis not present

## 2023-06-13 DIAGNOSIS — L259 Unspecified contact dermatitis, unspecified cause: Secondary | ICD-10-CM

## 2023-06-13 LAB — URINALYSIS, ROUTINE W REFLEX MICROSCOPIC
Bilirubin Urine: NEGATIVE
Glucose, UA: NEGATIVE mg/dL
Hgb urine dipstick: NEGATIVE
Ketones, ur: NEGATIVE mg/dL
Leukocytes,Ua: NEGATIVE
Nitrite: NEGATIVE
Protein, ur: NEGATIVE mg/dL
Specific Gravity, Urine: 1.01 (ref 1.005–1.030)
pH: 5 (ref 5.0–8.0)

## 2023-06-13 LAB — WET PREP, GENITAL
Clue Cells Wet Prep HPF POC: NONE SEEN
Sperm: NONE SEEN
Trich, Wet Prep: NONE SEEN
WBC, Wet Prep HPF POC: <10 (ref <10)
Yeast Wet Prep HPF POC: NONE SEEN

## 2023-06-13 LAB — PREGNANCY, URINE: Preg Test, Ur: NEGATIVE

## 2023-06-13 MED ORDER — ONDANSETRON 4 MG PO TBDP
4.0000 mg | ORAL_TABLET | Freq: Once | ORAL | Status: AC
  Administered 2023-06-13: 4 mg via ORAL
  Filled 2023-06-13: qty 1

## 2023-06-13 NOTE — ED Provider Notes (Signed)
[facility] Provider Note    Event Date/Time   First MD Initiated Contact with Patient 06/13/23 [phone]     (approximate)   History   Emesis and Vaginal Itching   HPI  Cheyenne Wise is a 22 y.o. female   presents to the ED with complaint of vaginal irritation.  Patient states she was seen at a clinic and prescribed nystatin cream and told to get hydrocortisone cream as needed for itching.  Patient denies any pain or vaginal discharge.  Patient had 1 episode of emesis this morning.      Physical Exam   Triage Vital Signs: ED Triage Vitals  Encounter Vitals Group     BP 06/13/23 0526 123/64     Systolic BP Percentile --      Diastolic BP Percentile --      Pulse Rate 06/13/23 0526 73     Resp 06/13/23 0526 18     Temp 06/13/23 0526 98.3 F (36.8 C)     Temp Source 06/13/23 0526 Oral     SpO2 06/13/23 0526 100 %     Weight 06/13/23 0528 145 lb 8.1 oz (66 kg)     Height --      Head Circumference --      Peak Flow --      Pain Score 06/13/23 0528 0     Pain Loc --      Pain Education --      Exclude from Growth Chart --     Most recent vital signs: Vitals:   06/13/23 0526 06/13/23 0933  BP: 123/64 127/73  Pulse: 73 82  Resp: 18 18  Temp: 98.3 F (36.8 C)   SpO2: 100% 100%     General: Awake, no distress.  CV:  Good peripheral perfusion.  Resp:  Normal effort.  Abd:  No distention.  Soft, nontender, bowel sounds normoactive x 4 quadrants. Other:  On examination of the genitalia there is no gross deformity, edema or skin discoloration.  There is some irritation at the base of the hair shafts on the right labia where patient has been shaving.  No abscess formation appreciated on palpation.   ED Results / Procedures / Treatments   Labs (all labs ordered are listed, but only abnormal results are displayed) Labs Reviewed  URINALYSIS, ROUTINE W REFLEX MICROSCOPIC - Abnormal; Notable for the following components:      Result Value    Color, Urine STRAW (*)    APPearance HAZY (*)    All other components within normal limits  WET PREP, GENITAL  PREGNANCY, URINE      PROCEDURES:  Critical Care performed:   Procedures   MEDICATIONS ORDERED IN ED: Medications  ondansetron (ZOFRAN-ODT) disintegrating tablet 4 mg (4 mg Oral Given 06/13/23 0942)     IMPRESSION / MDM / ASSESSMENT AND PLAN / ED COURSE  I reviewed the triage vital signs and the nursing notes.   Differential diagnosis includes, but is not limited to, contact dermatitis, yeast infection, trichomonas, bacterial vaginosis  22 year old female presents to the ED with complaint of vaginal itching/irritation.  She was seen earlier in a clinic where she was prescribed nystatin which she states is not helping.  Wet prep was reassuring and patient was made aware that this was negative.  On exam it looks more like there is irritation at the base of the hair shafts where she has been shaving.  Patient was advised to discontinue shaving at this time  and she may continue using the Hydrocortisone cream that she has been using.  She is to follow-up with her PCP if any continued problems or [facility] urgent care.      Patient's presentation is most consistent with acute complicated illness / injury requiring diagnostic workup.  FINAL CLINICAL IMPRESSION(S) / ED DIAGNOSES   Final diagnoses:  Skin irritation from shaving     Rx / DC Orders   ED Discharge Orders     None        Note:  This document was prepared using Dragon voice recognition software and may include unintentional dictation errors.   Tommi Rumps, PA-C 06/13/23 1015    Minna Antis, MD 06/13/23 [phone]

## 2023-06-13 NOTE — ED Triage Notes (Signed)
Patient c/o emesis x 1 that started this morning. Patient recently seen for genital irritation at the clinic and given a cream, and is wondering if the episode of emesis is related to that. Patient denies pain.

## 2023-06-13 NOTE — Discharge Instructions (Signed)
Follow-up with your primary care provider if any continued problems or [place] urgent care if needed.  At this time discontinue shaving in that area which can help with the irritation.  You may continue using the hydrocortisone cream and discontinue using soap at this time until this area has cleared.

## 2023-07-09 ENCOUNTER — Other Ambulatory Visit: Payer: Self-pay

## 2023-07-09 DIAGNOSIS — Z5321 Procedure and treatment not carried out due to patient leaving prior to being seen by health care provider: Secondary | ICD-10-CM | POA: Diagnosis not present

## 2023-07-09 DIAGNOSIS — R102 Pelvic and perineal pain: Secondary | ICD-10-CM | POA: Insufficient documentation

## 2023-07-09 DIAGNOSIS — N7689 Other specified inflammation of vagina and vulva: Secondary | ICD-10-CM | POA: Diagnosis present

## 2023-07-09 NOTE — ED Triage Notes (Signed)
Pt sts that she has been having vaginal pain for the last three days. Pt denies any discharge and odor.

## 2023-07-09 NOTE — ED Provider Triage Note (Signed)
Emergency Medicine Provider Triage Evaluation Note  Cheyenne Wise , a 23 y.o. female  was evaluated in triage.  Pt complains of pain in the clitoris area x 3-4 days. Denies vaginal discharge or foul odor.  Physical Exam  BP 139/80 (BP Location: Left Arm)   Pulse 63   Temp 97.9 F (36.6 C) (Oral)   Resp 17  Gen:   Awake, no distress   Resp:  Normal effort  MSK:   Moves extremities without difficulty  Other:    Medical Decision Making  Medically screening exam initiated at 9:14 PM.  Appropriate orders placed.  Cheyenne Wise was informed that the remainder of the evaluation will be completed by another provider, this initial triage assessment does not replace that evaluation, and the importance of remaining in the ED until their evaluation is complete.    Chinita Pester, FNP 07/09/23 2116

## 2023-07-10 ENCOUNTER — Emergency Department
Admission: EM | Admit: 2023-07-10 | Discharge: 2023-07-10 | Disposition: A | Discharge status: Home / Self Care | Payer: Medicaid Other | Source: Home / Self Care | Attending: Emergency Medicine | Admitting: Emergency Medicine

## 2023-07-10 ENCOUNTER — Emergency Department
Admission: EM | Admit: 2023-07-10 | Discharge: 2023-07-10 | Discharge status: Against Medical Advice | Payer: Medicaid Other | Attending: Emergency Medicine | Admitting: Emergency Medicine

## 2023-07-10 ENCOUNTER — Other Ambulatory Visit: Payer: Self-pay

## 2023-07-10 DIAGNOSIS — N9089 Other specified noninflammatory disorders of vulva and perineum: Secondary | ICD-10-CM

## 2023-07-10 DIAGNOSIS — N7689 Other specified inflammation of vagina and vulva: Secondary | ICD-10-CM | POA: Insufficient documentation

## 2023-07-10 LAB — URINALYSIS, ROUTINE W REFLEX MICROSCOPIC
Bilirubin Urine: NEGATIVE
Glucose, UA: NEGATIVE mg/dL
Hgb urine dipstick: NEGATIVE
Ketones, ur: 5 mg/dL — AB
Leukocytes,Ua: NEGATIVE
Nitrite: NEGATIVE
Protein, ur: NEGATIVE mg/dL
Specific Gravity, Urine: 1.025 (ref 1.005–1.030)
pH: 5 (ref 5.0–8.0)

## 2023-07-10 LAB — POC URINE PREG, ED: Preg Test, Ur: NEGATIVE

## 2023-07-10 NOTE — ED Provider Notes (Signed)
[facility] Emergency Department Provider Note     Event Date/Time   First MD Initiated Contact with Patient 07/10/23 1334     (approximate)   History   vaginal discomfort   HPI  Cheyenne Wise is a 23 y.o. female with a non-contributory medical history, presents to the ED for evaluation of some vaginal discomfort.  Patient would endorse an area of local irritation to the labia minora, that she is noted over the last 5 days.  She denies any abnormal vaginal bleeding, vaginal or other vaginal irritation.  She reports area in her pain, looks like a small bump, but is tender and sensitive with contact with her underwear.  Patient otherwise does not endorse any concern for STD/STI.  Physical Exam   Triage Vital Signs: ED Triage Vitals  Encounter Vitals Group     BP 07/10/23 1152 (!) 140/64     Systolic BP Percentile --      Diastolic BP Percentile --      Pulse Rate 07/10/23 1152 70     Resp 07/10/23 1152 16     Temp 07/10/23 1152 98 F (36.7 C)     Temp src --      SpO2 07/10/23 1152 98 %     Weight 07/10/23 1151 145 lb (65.8 kg)     Height 07/10/23 1151 5\' 5"  (1.651 m)     Head Circumference --      Peak Flow --      Pain Score 07/10/23 1151 4     Pain Loc --      Pain Education --      Exclude from Growth Chart --     Most recent vital signs: Vitals:   07/10/23 1152 07/10/23 1522  BP: (!) 140/64 135/63  Pulse: 70 71  Resp: 16 18  Temp: 98 F (36.7 C)   SpO2: 98% 98%    General Awake, no distress. NAD HEENT NCAT. PERRL. EOMI. No rhinorrhea. Mucous membranes are moist.  CV:  Good peripheral perfusion.  RESP:  Normal effort.  ABD:  No distention.  GU:  Normal external genitalia, patient with an area to the labia minora just inferior to the clitoris, that shows a corrugated skin.  This seems to be 1 area that is somewhat irritated or inflamed but without erythema, induration, warmth, purulence, fluctuance.   ED Results /  Procedures / Treatments   Labs (all labs ordered are listed, but only abnormal results are displayed) Labs Reviewed  URINALYSIS, ROUTINE W REFLEX MICROSCOPIC - Abnormal; Notable for the following components:      Result Value   Color, Urine YELLOW (*)    APPearance CLEAR (*)    Ketones, ur 5 (*)    All other components within normal limits  POC URINE PREG, ED   EKG   RADIOLOGY  No results found.   PROCEDURES:  Critical Care performed: No  Procedures   MEDICATIONS ORDERED IN ED: Medications - No data to display   IMPRESSION / MDM / ASSESSMENT AND PLAN / ED COURSE  I reviewed the triage vital signs and the nursing notes.                              Differential diagnosis includes, but is not limited to, contact dermatitis, ingrown hair, Bartholin gland cyst abscess, fissure, vulvar laceration  Patient's presentation is most consistent with acute complicated illness / injury  requiring diagnostic workup.  Patient's diagnosis is consistent with local vulvar irritation. Patient will be discharged home with instructions to use barrier cream to help reduce irritation inflammation to the area. Patient is to follow up with her GYN as discussed, as needed or otherwise directed. Patient is given ED precautions to return to the ED for any worsening or new symptoms.  FINAL CLINICAL IMPRESSION(S) / ED DIAGNOSES   Final diagnoses:  Vulvar irritation     Rx / DC Orders   ED Discharge Orders     None        Note:  This document was prepared using Dragon voice recognition software and may include unintentional dictation errors.    Lissa Hoard, PA-C 07/11/23 2321    Merwyn Katos, MD 07/12/23 [phone]

## 2023-07-10 NOTE — ED Triage Notes (Signed)
Pt to ED for vaginal discomfort x5 days. Denies discharge.

## 2023-07-10 NOTE — Discharge Instructions (Signed)
Your exam is normal. No evidence of any serious infection. Keep the area covered with OTC diaper rash cream. Follow-up with a local provider as discussed.  Please go to the following website to schedule new (and existing) patient appointments:   [email or web address]   The following is a list of primary care offices in the area who are accepting new patients at this time.  Please reach out to one of them directly and let them know you would like to schedule an appointment to follow up on an Emergency Department visit, and/or to establish a new primary care provider (PCP).  There are likely other primary care clinics in the are who are accepting new patients, but this is an excellent place to start:  [facility] Lead physician: Dr Shirlee Latch [address] [place], [place] [address] [phone]  [facility] Lead Physician: Dr Alba Cory [address], [place], [place] [address] [phone]  [facility]  Lead Physician: Dr Olevia Perches [address] [place], [place] [address] [phone]  [facility] Lead Physician: Dr Sofie Hartigan [address] [place], [place] [address] [phone]  [facility] at [facility] Lead Physician: Dr Bari Edward [address] [place], [place], [place] [address] [phone]
